# Patient Record
Sex: Female | Born: 1980
Health system: Southern US, Community
[De-identification: ages and names within clinical notes are randomized; demographics above are authoritative.]

## PROBLEM LIST (undated history)

## (undated) DIAGNOSIS — N390 Urinary tract infection, site not specified: Secondary | ICD-10-CM

## (undated) DIAGNOSIS — B019 Varicella without complication: Secondary | ICD-10-CM

## (undated) DIAGNOSIS — A749 Chlamydial infection, unspecified: Secondary | ICD-10-CM

## (undated) DIAGNOSIS — D649 Anemia, unspecified: Secondary | ICD-10-CM

## (undated) DIAGNOSIS — T7840XA Allergy, unspecified, initial encounter: Secondary | ICD-10-CM

## (undated) HISTORY — PX: WISDOM TOOTH EXTRACTION: SHX21

## (undated) HISTORY — DX: Urinary tract infection, site not specified: N39.0

## (undated) HISTORY — DX: Allergy, unspecified, initial encounter: T78.40XA

## (undated) HISTORY — DX: Anemia, unspecified: D64.9

## (undated) HISTORY — DX: Chlamydial infection, unspecified: A74.9

## (undated) HISTORY — DX: Varicella without complication: B01.9

---

## 2006-05-05 ENCOUNTER — Emergency Department (HOSPITAL_COMMUNITY): Admission: EM | Admit: 2006-05-05 | Discharge: 2006-05-05 | Payer: Self-pay | Admitting: Family Medicine

## 2008-11-23 ENCOUNTER — Inpatient Hospital Stay (HOSPITAL_COMMUNITY): Admission: AD | Admit: 2008-11-23 | Discharge: 2008-11-24 | Payer: Self-pay | Admitting: Obstetrics and Gynecology

## 2010-03-07 ENCOUNTER — Inpatient Hospital Stay (HOSPITAL_COMMUNITY): Admission: AD | Admit: 2010-03-07 | Discharge: 2009-07-16 | Payer: Self-pay | Admitting: Obstetrics and Gynecology

## 2010-06-18 LAB — CBC
HCT: 36.4 % (ref 36.0–46.0)
Hemoglobin: 11.8 g/dL — ABNORMAL LOW (ref 12.0–15.0)
MCHC: 32.3 g/dL (ref 30.0–36.0)
MCHC: 32.9 g/dL (ref 30.0–36.0)
MCV: 86.8 fL (ref 78.0–100.0)
Platelets: 225 10*3/uL (ref 150–400)
Platelets: 284 10*3/uL (ref 150–400)
RDW: 14.5 % (ref 11.5–15.5)

## 2010-07-06 LAB — HCG, QUANTITATIVE, PREGNANCY: hCG, Beta Chain, Quant, S: 17291 m[IU]/mL — ABNORMAL HIGH (ref <5)

## 2011-05-28 ENCOUNTER — Encounter: Payer: BC Managed Care – PPO | Admitting: Obstetrics and Gynecology

## 2011-10-22 ENCOUNTER — Encounter: Payer: Self-pay | Admitting: Family Medicine

## 2011-10-22 ENCOUNTER — Ambulatory Visit (INDEPENDENT_AMBULATORY_CARE_PROVIDER_SITE_OTHER): Payer: BC Managed Care – PPO | Admitting: Family Medicine

## 2011-10-22 VITALS — BP 132/79 | HR 80 | Temp 98.3°F | Ht 66.5 in | Wt 172.8 lb

## 2011-10-22 DIAGNOSIS — D649 Anemia, unspecified: Secondary | ICD-10-CM

## 2011-10-22 DIAGNOSIS — Z0000 Encounter for general adult medical examination without abnormal findings: Secondary | ICD-10-CM

## 2011-10-22 LAB — CBC WITH DIFFERENTIAL/PLATELET
Basophils Absolute: 0 10*3/uL (ref 0.0–0.1)
Eosinophils Absolute: 0.4 10*3/uL (ref 0.0–0.7)
Lymphocytes Relative: 40.6 % (ref 12.0–46.0)
MCHC: 32.2 g/dL (ref 30.0–36.0)
Neutrophils Relative %: 46.8 % (ref 43.0–77.0)
Platelets: 329 10*3/uL (ref 150.0–400.0)
RDW: 18.2 % — ABNORMAL HIGH (ref 11.5–14.6)

## 2011-10-22 LAB — HEPATIC FUNCTION PANEL
ALT: 12 U/L (ref 0–35)
AST: 14 U/L (ref 0–37)
Bilirubin, Direct: 0 mg/dL (ref 0.0–0.3)
Total Bilirubin: 0.3 mg/dL (ref 0.3–1.2)

## 2011-10-22 LAB — LIPID PANEL
Cholesterol: 186 mg/dL (ref 0–200)
LDL Cholesterol: 124 mg/dL — ABNORMAL HIGH (ref 0–99)
VLDL: 10 mg/dL (ref 0.0–40.0)

## 2011-10-22 LAB — BASIC METABOLIC PANEL
Chloride: 105 mEq/L (ref 96–112)
Creatinine, Ser: 0.7 mg/dL (ref 0.4–1.2)
Potassium: 3.5 mEq/L (ref 3.5–5.1)
Sodium: 137 mEq/L (ref 135–145)

## 2011-10-22 NOTE — Patient Instructions (Addendum)
You look great!  Keep up the good work! We'll notify you of your lab results Try and get regular exercise and make healthy food choices Call with any questions or concerns- particularly if you continue to have trouble sleeping! Think of [name] as your home base! Welcome!  We're glad to have you!!!

## 2011-10-22 NOTE — Progress Notes (Signed)
  Subjective:    Patient ID: Tracy Burch, female    DOB: 10/21/1980, 31 y.o.   MRN: [id number]  HPI New to establish.  Previous MD- None  OB- [facility]  No concerns today.   Review of Systems Patient reports no vision/ hearing changes, adenopathy,fever, weight change,  persistant/recurrent hoarseness , swallowing issues, chest pain, palpitations, edema, persistant/recurrent cough, hemoptysis, dyspnea (rest/exertional/paroxysmal nocturnal), gastrointestinal bleeding (melena, rectal bleeding), abdominal pain, significant heartburn, bowel changes, GU symptoms (dysuria, hematuria, incontinence), Gyn symptoms (abnormal  bleeding, pain),  syncope, focal weakness, memory loss, numbness & tingling, skin/hair/nail changes, abnormal bruising or bleeding, anxiety, or depression.     Objective:   Physical Exam General Appearance:    Alert, cooperative, no distress, appears stated age  Head:    Normocephalic, without obvious abnormality, atraumatic  Eyes:    PERRL, conjunctiva/corneas clear, EOM's intact, fundi    benign, both eyes  Ears:    Normal TM's and external ear canals, both ears  Nose:   Nares normal, septum midline, mucosa normal, no drainage    or sinus tenderness  Throat:   Lips, mucosa, and tongue normal; teeth and gums normal  Neck:   Supple, symmetrical, trachea midline, no adenopathy;    Thyroid: no enlargement/tenderness/nodules  Back:     Symmetric, no curvature, ROM normal, no CVA tenderness  Lungs:     Clear to auscultation bilaterally, respirations unlabored  Chest Wall:    No tenderness or deformity   Heart:    Regular rate and rhythm, S1 and S2 normal, no murmur, rub   or gallop  Breast Exam:    Deferred to GYN  Abdomen:     Soft, non-tender, bowel sounds active all four quadrants,    no masses, no organomegaly  Genitalia:    Deferred to GYN  Rectal:    Extremities:   Extremities normal, atraumatic, no cyanosis or edema  Pulses:   2+ and symmetric all  extremities  Skin:   Skin color, texture, turgor normal, no rashes or lesions  Lymph nodes:   Cervical, supraclavicular, and axillary nodes normal  Neurologic:   CNII-XII intact, normal strength, sensation and reflexes    throughout          Assessment & Plan:

## 2011-10-22 NOTE — Assessment & Plan Note (Signed)
Pt's PE WNL.  UTD on GYN.  Check labs.  Anticipatory guidance provided.  Pt to f/u if sleeping habits don't improve- this was not addressed today.

## 2011-10-23 MED ORDER — FERROUS SULFATE 325 (65 FE) MG PO TABS: 325.0000 mg | ORAL_TABLET | Freq: Every day | ORAL | Status: DC

## 2011-10-23 NOTE — Addendum Note (Signed)
Addended by: Derry Lory A on: 10/23/2011 05:35 PM   Modules accepted: Orders

## 2011-10-26 LAB — VITAMIN D 1,25 DIHYDROXY: Vitamin D3 1, 25 (OH)2: 103 pg/mL

## 2011-12-03 ENCOUNTER — Encounter: Payer: Self-pay | Admitting: Obstetrics and Gynecology

## 2011-12-03 ENCOUNTER — Telehealth: Payer: Self-pay | Admitting: Obstetrics and Gynecology

## 2011-12-03 ENCOUNTER — Ambulatory Visit (INDEPENDENT_AMBULATORY_CARE_PROVIDER_SITE_OTHER): Payer: BC Managed Care – PPO | Admitting: Obstetrics and Gynecology

## 2011-12-03 ENCOUNTER — Telehealth: Payer: Self-pay | Admitting: Family Medicine

## 2011-12-03 VITALS — BP 114/72 | HR 78 | Wt 174.0 lb

## 2011-12-03 DIAGNOSIS — Z113 Encounter for screening for infections with a predominantly sexual mode of transmission: Secondary | ICD-10-CM

## 2011-12-03 DIAGNOSIS — N898 Other specified noninflammatory disorders of vagina: Secondary | ICD-10-CM

## 2011-12-03 LAB — POCT WET PREP (WET MOUNT)

## 2011-12-03 MED ORDER — FLUCONAZOLE 150 MG PO TABS: 150.0000 mg | ORAL_TABLET | Freq: Once | ORAL | Status: AC

## 2011-12-03 MED ORDER — METRONIDAZOLE 500 MG PO TABS: 500.0000 mg | ORAL_TABLET | Freq: Three times a day (TID) | ORAL | Status: AC

## 2011-12-03 NOTE — Patient Instructions (Signed)
Bacterial Vaginosis Bacterial vaginosis (BV) is a vaginal infection where the normal balance of bacteria in the vagina is disrupted. The normal balance is then replaced by an overgrowth of certain bacteria. There are several different kinds of bacteria that can cause BV. BV is the most common vaginal infection in women of childbearing age. CAUSES   The cause of BV is not fully understood. BV develops when there is an increase or imbalance of harmful bacteria.   Some activities or behaviors can upset the normal balance of bacteria in the vagina and put women at increased risk including:   Having a new sex partner or multiple sex partners.   Douching.   Using an intrauterine device (IUD) for contraception.   It is not clear what role sexual activity plays in the development of BV. However, women that have never had sexual intercourse are rarely infected with BV.  Women do not get BV from toilet seats, bedding, swimming pools or from touching objects around them.  SYMPTOMS   Grey vaginal discharge.   A fish-like odor with discharge, especially after sexual intercourse.   Itching or burning of the vagina and vulva.   Burning or pain with urination.    Avoid: - excess soap on genital area (consider using plain oatmeal soap) - use of powder or sprays in genital area - douching - wearing underwear to bed (except with menses) - using more than is directed detergent when washing clothes - tight fitting garments around genital area - excess sugar intake    Some women have no signs or symptoms at all.  DIAGNOSIS  Your caregiver must examine the vagina for signs of BV. Your caregiver will perform lab tests and look at the sample of vaginal fluid through a microscope. They will look for bacteria and abnormal cells (clue cells), a pH test higher than 4.5, and a positive amine test all associated with BV.  RISKS AND COMPLICATIONS   Pelvic inflammatory disease (PID).   Infections  following gynecology surgery.   Developing HIV.   Developing herpes virus.  TREATMENT  Sometimes BV will clear up without treatment. However, all women with symptoms of BV should be treated to avoid complications, especially if gynecology surgery is planned. Female partners generally do not need to be treated. However, BV may spread between female sex partners so treatment is helpful in preventing a recurrence of BV.   BV may be treated with antibiotics. The antibiotics come in either pill or vaginal cream forms. Either can be used with nonpregnant or pregnant women, but the recommended dosages differ. These antibiotics are not harmful to the baby.   BV can recur after treatment. If this happens, a second round of antibiotics will often be prescribed.   Treatment is important for pregnant women. If not treated, BV can cause a premature delivery, especially for a pregnant woman who had a premature birth in the past. All pregnant women who have symptoms of BV should be checked and treated.   For chronic reoccurrence of BV, treatment with a type of prescribed gel vaginally twice a week is helpful.  HOME CARE INSTRUCTIONS   Finish all medication as directed by your caregiver.   Do not have sex until treatment is completed.   Tell your sexual partner that you have a vaginal infection. They should see their caregiver and be treated if they have problems, such as a mild rash or itching.   Practice safe sex. Use condoms. Only have 1 sex partner.  PREVENTION  Basic prevention steps can help reduce the risk of upsetting the natural balance of bacteria in the vagina and developing BV:  Do not have sexual intercourse (be abstinent).   Do not douche.   Use all of the medicine prescribed for treatment of BV, even if the signs and symptoms go away.   Tell your sex partner if you have BV. That way, they can be treated, if needed, to prevent reoccurrence.  SEEK MEDICAL CARE IF:   Your symptoms are  not improving after 3 days of treatment.   You have increased discharge, pain, or fever.  MAKE SURE YOU:   Understand these instructions.   Will watch your condition.   Will get help right away if you are not doing well or get worse.  FOR MORE INFORMATION  Division of STD Prevention (DSTDP), Centers for Disease Control and Prevention: [employer] American Social Health Association (ASHA): www.ashastd.org  Document Released: 03/17/2005 Document Revised: 03/06/2011 Document Reviewed: 09/07/2008 [facility] Patient Information 2012 [place], [place].

## 2011-12-03 NOTE — Telephone Encounter (Signed)
TRIAGE/APPT REQ. °

## 2011-12-03 NOTE — Telephone Encounter (Signed)
Caller: Tracy Burch/Patient; Patient Name: Tracy Burch; PCP: Sheliah Hatch.; Best Callback Phone Number: [phone]; Reason for call: Chest Pain/Chest Discomfort.  Onset 12/01/11.   Sharp chest pain with expiration and inspiration. Pain located in the center of the chest.  No trauma.  Afebrile.  Pt does cough when pain occurs and then it goes away.  All emergent symptoms ruled out per Chest Pain protocol with exception to 'Pains are sharp, stabbing, come and go and last only a few seconds and not previous evaluated'.  See Provider within 72 hrs. Transferred caller to office to schedule an appt.

## 2011-12-03 NOTE — Progress Notes (Signed)
Color: white Odor: no Itching:yes Thin:no Thick:yes Fever:no Dyspareunia:no Hx PID:no HX STD:no Pelvic Pain:no Desires Gc/CT:yes Desires HIV,RPR,HbsAG:no

## 2011-12-03 NOTE — Telephone Encounter (Signed)
Noted will see MD Beverely Low tomorrow, given verba order from MD Tabori ok for pt to wait for apt tomorrow per sxs listed as going away

## 2011-12-03 NOTE — Telephone Encounter (Signed)
Tc to pt regarding msg.  Pt states she has been having a thick vaginal d/c and slight itching, denies any odor.  Pt scheduled for eval today w/ EP @ 1330.

## 2011-12-03 NOTE — Progress Notes (Addendum)
30 YO complains of a heavy vaginal discharge with mild itchiness.   Also wants to be tested for GC/CT.  O: Wet Prep: pH-5.5,  whiff-positive,  many-clue cells  Pelvic:  EGBUS-with adherent white discharge, vagina with large discharge-grey, cervix-no lesions, uterus-normal, adnexae-normal   A: Bacterial Vaginosis  P: Metronidazole 500 mg bid x 7 days, no refills;  Etoh precautions     Perineal hygiene     Diflucan 150 mg  #1 1 po stat 1 refill     Reviewed causes of BV & Yeast and difference in the two     RTO-as scheduled or prn  Keino Placencia, PA-C

## 2011-12-04 ENCOUNTER — Ambulatory Visit (INDEPENDENT_AMBULATORY_CARE_PROVIDER_SITE_OTHER): Payer: BC Managed Care – PPO | Admitting: Family Medicine

## 2011-12-04 ENCOUNTER — Encounter: Payer: Self-pay | Admitting: Family Medicine

## 2011-12-04 VITALS — BP 121/75 | HR 94 | Temp 98.5°F | Ht 67.0 in | Wt 170.2 lb

## 2011-12-04 DIAGNOSIS — G4700 Insomnia, unspecified: Secondary | ICD-10-CM

## 2011-12-04 DIAGNOSIS — R079 Chest pain, unspecified: Secondary | ICD-10-CM | POA: Insufficient documentation

## 2011-12-04 MED ORDER — NAPROXEN 500 MG PO TABS: 500.0000 mg | ORAL_TABLET | Freq: Two times a day (BID) | ORAL | Status: DC

## 2011-12-04 MED ORDER — TRAZODONE HCL 50 MG PO TABS: 25.0000 mg | ORAL_TABLET | Freq: Every evening | ORAL | Status: DC | PRN

## 2011-12-04 NOTE — Assessment & Plan Note (Signed)
New.  Due to failure of OTC meds will start Trazodone.  Pt expressed understanding and is in agreement w/ plan.

## 2011-12-04 NOTE — Patient Instructions (Addendum)
I suspect this is costochondritis (inflammation of the cartilage and ribs) Start the Naproxen twice daily (w/ food) for 7-10 days and then as needed If the pain changes or worsens- please call or go to the ER Your EKG is normal and this doesn't appear to be your heart- this is good news! Call with any questions or concerns Hang in there!!

## 2011-12-04 NOTE — Assessment & Plan Note (Signed)
New.  Unlikely to be cardiac as it is not associated w/ exertion, + TTP on chest wall, few risk factors.  EKG done- see document for interpretation.  Suspect costochondritis vs GERD.  Start NSAIDs.  Reviewed supportive care and red flags that should prompt return.  Pt expressed understanding and is in agreement w/ plan.

## 2011-12-04 NOTE — Progress Notes (Signed)
  Subjective:    Patient ID: Tracy Burch, female    DOB: February 04, 1981, 31 y.o.   MRN: [id number]  HPI Chest pain- sxs started Sunday, occurs w/ breathing.  Worse in the AM.  Has not worsened since Sunday.  sxs improve as day goes on.  Denies SOB.  No chest tenderness to palpation.  Pain is 'dead center'.  No radiation of pain.  Nothing makes pain worse.  Nothing improves pain- 'i haven't done anything'.  Pain will last ~1 minute.  Resolves spontaneously.  Sometimes pain is 'sharp' and other times is 'throbbing'.  Has hx of similar 2 yrs ago.  Denies increased stress.  Hx of seasonal allergies- taking allegra daily.  Denies GERD- 'i didn't even have that when i was pregnant'  Insomnia- ongoing problem for pt.  Has tried OTC unisom, tylenol PM, advil PM w/out relief.  Will take 1-2 hrs to fall asleep and 'then i'm up every hour'.  Denies anxiety, palpitations.  'i just want one good night's sleep!'   Review of Systems For ROS see HPI     Objective:   Physical Exam  Vitals reviewed. Constitutional: She is oriented to person, place, and time. She appears well-developed and well-nourished. No distress.  HENT:  Head: Normocephalic and atraumatic.  Eyes: Conjunctivae and EOM are normal. Pupils are equal, round, and reactive to light.  Neck: Normal range of motion. Neck supple. No thyromegaly present.  Cardiovascular: Normal rate, regular rhythm, normal heart sounds and intact distal pulses.   No murmur heard. Pulmonary/Chest: Effort normal and breath sounds normal. No respiratory distress. She exhibits tenderness (over sternocostal jxn).  Abdominal: Soft. She exhibits no distension. There is no tenderness.  Musculoskeletal: She exhibits no edema.  Lymphadenopathy:    She has no cervical adenopathy.  Neurological: She is alert and oriented to person, place, and time.  Skin: Skin is warm and dry.  Psychiatric: She has a normal mood and affect. Her behavior is normal.          Assessment &  Plan:

## 2012-01-28 ENCOUNTER — Telehealth: Payer: Self-pay

## 2012-01-28 MED ORDER — ESZOPICLONE 2 MG PO TABS: 2.0000 mg | ORAL_TABLET | Freq: Every evening | ORAL | Status: DC | PRN

## 2012-01-28 NOTE — Telephone Encounter (Signed)
Ok for [facility] 2mg  nightly prn.  #30, 1 refill

## 2012-01-28 NOTE — Telephone Encounter (Signed)
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 PLz advise      MW

## 2012-01-28 NOTE — Telephone Encounter (Signed)
Spoke to pt to advise results/instructions. Pt understood. rx sent to pharmacy by e-script Pt understood the rx has been sent

## 2012-02-10 ENCOUNTER — Telehealth: Payer: Self-pay | Admitting: *Deleted

## 2012-02-10 NOTE — Telephone Encounter (Signed)
Preferred drugs Ambien or sonata, would one of these med be appropriate for Pt. .Please advise

## 2012-02-10 NOTE — Telephone Encounter (Signed)
Pt requested Lunesta but can switch to Ambien 5mg  tabs QHS prn, #30, 1 refill

## 2012-02-11 NOTE — Telephone Encounter (Signed)
Left message to call office

## 2012-02-12 ENCOUNTER — Telehealth: Payer: Self-pay | Admitting: *Deleted

## 2012-02-12 MED ORDER — ZOLPIDEM TARTRATE 5 MG PO TABS: 5.0000 mg | ORAL_TABLET | Freq: Every evening | ORAL | Status: DC | PRN

## 2012-02-12 NOTE — Telephone Encounter (Signed)
Pt left msg on triage vmail asking for a return call. No other information provided.  

## 2012-02-12 NOTE — Telephone Encounter (Signed)
See other encounter.

## 2012-02-12 NOTE — Telephone Encounter (Signed)
Discuss with patient, Rx sent. 

## 2012-04-29 ENCOUNTER — Encounter: Payer: Self-pay | Admitting: Family Medicine

## 2012-04-29 ENCOUNTER — Ambulatory Visit (INDEPENDENT_AMBULATORY_CARE_PROVIDER_SITE_OTHER): Payer: BC Managed Care – PPO | Admitting: Family Medicine

## 2012-04-29 VITALS — BP 130/80 | HR 82 | Temp 97.7°F | Ht 67.75 in | Wt 183.8 lb

## 2012-04-29 DIAGNOSIS — G4700 Insomnia, unspecified: Secondary | ICD-10-CM

## 2012-04-29 DIAGNOSIS — H10029 Other mucopurulent conjunctivitis, unspecified eye: Secondary | ICD-10-CM | POA: Insufficient documentation

## 2012-04-29 MED ORDER — ESZOPICLONE 2 MG PO TABS: 2.0000 mg | ORAL_TABLET | Freq: Every evening | ORAL | Status: DC | PRN

## 2012-04-29 MED ORDER — POLYMYXIN B-TRIMETHOPRIM 10000-0.1 UNIT/ML-% OP SOLN: OPHTHALMIC | Status: DC

## 2012-04-29 NOTE — Progress Notes (Signed)
  Subjective:    Patient ID: Tracy Burch, female    DOB: [birth year], 32 y.o.   MRN: [id number]  HPI ? Pink eye- first noted yesterday.  Eye is red, irritated, itchy- 'feels like there's sand in it'.  Had thin, watery drainage this AM- not matted shut.  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  Insomnia- did not do well on Ambien, 'i did weird stuff', and trazodone isn't effective.  Wants to again try Lunesta and see if insurance will pay for it since 2 other options were unsuccessful.   Review of Systems For ROS see HPI     Objective:   Physical Exam  Vitals reviewed. Constitutional: She appears well-developed and well-nourished. No distress.  HENT:  Head: Normocephalic and atraumatic.       No TTP over sinuses TMs normal bilaterally  Eyes: EOM are normal. Pupils are equal, round, and reactive to light. Right eye exhibits no discharge. Left eye exhibits no discharge.       R conjunctival injxn w/ limbic sparing L conjunctiva WNL  Psychiatric: She has a normal mood and affect. Her behavior is normal. Thought content normal.          Assessment & Plan:

## 2012-04-29 NOTE — Patient Instructions (Addendum)
Start the eye drops as directed Try and avoid itching Wash hands frequently Start the [facility] in there!!!

## 2012-04-29 NOTE — Assessment & Plan Note (Signed)
Unchanged.  Pt has now failed Ambien and Trazodone.  Will again try and send Lunesta.

## 2012-04-29 NOTE — Assessment & Plan Note (Signed)
New.  No evidence of OM or sinusitis.  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  Pt expressed understanding and is in agreement w/ plan.

## 2012-05-03 ENCOUNTER — Ambulatory Visit: Payer: BC Managed Care – PPO | Admitting: Obstetrics and Gynecology

## 2012-05-03 ENCOUNTER — Encounter: Payer: Self-pay | Admitting: Obstetrics and Gynecology

## 2012-05-03 VITALS — BP 118/72 | Temp 98.5°F | Ht 65.0 in | Wt 189.0 lb

## 2012-05-03 DIAGNOSIS — Z01419 Encounter for gynecological examination (general) (routine) without abnormal findings: Secondary | ICD-10-CM

## 2012-05-03 DIAGNOSIS — Z124 Encounter for screening for malignant neoplasm of cervix: Secondary | ICD-10-CM

## 2012-05-03 MED ORDER — LEVONORGEST-ETH ESTRAD 91-DAY 0.15-0.03 MG PO TABS: 1.0000 | ORAL_TABLET | Freq: Every day | ORAL | Status: DC

## 2012-05-03 MED ORDER — AMBULATORY NON FORMULARY MEDICATION: 600.0000 mg | Status: DC

## 2012-05-03 MED ORDER — FLUCONAZOLE 150 MG PO TABS: 150.0000 mg | ORAL_TABLET | Freq: Once | ORAL | Status: DC

## 2012-05-03 NOTE — Patient Instructions (Signed)
[place] pharmacies that carry Boric Acid Capsules/Suppositories:  [facility] [address] (only)  [phone]  [facility]  [address] ([facility])  [phone]  [facility] ([place])  [address] [phone]  [facility]  [address] [phone]  [facility] [address] [place], [place] [phone]     

## 2012-05-03 NOTE — Progress Notes (Signed)
Subjective:    Tracy Burch is a 32 y.o. female, G1P0, who presents for an annual exam. The patient reports no complaints.  Menstrual cycle:   LMP: Patient's last menstrual period was 02/29/2012.             Review of Systems Pertinent items are noted in HPI. Denies pelvic pain, urinary tract symptoms, vaginitis symptoms, irregular bleeding, menopausal symptoms, change in bowel habits or rectal bleeding   Objective:    BP 118/72  Temp 98.5 F (36.9 C) (Oral)  Ht 5\' 5"  (1.651 m)  Wt 189 lb (85.73 kg)  BMI 31.45 kg/m2  LMP 02/29/2012    Wt Readings from Last 1 Encounters:  05/03/12 189 lb (85.73 kg)   Body mass index is 31.45 kg/(m^2). General Appearance: Alert, no acute distress HEENT: Grossly normal Neck / Thyroid: Supple, no thyromegaly or cervical adenopathy Lungs: Clear to auscultation bilaterally Back: No CVA tenderness Breast Exam: No masses or nodes.No dimpling, nipple retraction or discharge. Cardiovascular: Regular rate and rhythm.  Gastrointestinal: Soft, non-tender, no masses or organomegaly Pelvic Exam: EGBUS-wnl, vagina-normal rugae, cervix- without lesions or tenderness, uterus appears normal size shape and consistency, adnexae-no masses or tenderness Rectovaginal: no masses and normal sphincter tone Lymphatic Exam: Non-palpable nodes in neck, clavicular,  axillary, or inguinal regions  Skin: no rashes or abnormalities Extremities: no clubbing cyanosis or edema  Neurologic: grossly normal Psychiatric: Alert and oriented  Wet Prep: Urinalysis: UPT:    Assessment:   Routine GYN Exam   Plan:  Boric Acid Suppositories  600mg  #30 1 pv twice weekly x 4 weeks then as needed prn 11 refills  PAP sent  Seasonique  #1  1 po qd 4 refills  RTO 1 year or prn  Tyrell Seifer,ELMIRAPA-C

## 2012-05-03 NOTE — Progress Notes (Signed)
Regular Periods: yes Mammogram: no  Monthly Breast Ex.: yes Exercise: yes  Tetanus < 10 years: yes Seatbelts: yes  NI. Bladder Functn.: yes Abuse at home: no  Daily BM's: yes Stressful Work: no  Healthy Diet: yes Sigmoid-Colonoscopy: no  Calcium: no Medical problems this year: none   LAST PAP:1/13  Contraception: seasonique  Mammogram:  no  PCP: DR. Beverely Low  PMH: NO CHANGE  FMH: NO CHANGE  Last Bone Scan: NO  PT IS MARRIED.

## 2012-05-05 LAB — PAP IG W/ RFLX HPV ASCU

## 2012-05-10 ENCOUNTER — Telehealth: Payer: Self-pay | Admitting: *Deleted

## 2012-05-10 NOTE — Telephone Encounter (Signed)
Prior Auth approved 05-06-12 until 01-30-15, pharmacy faxed approval letter scan to chart

## 2012-07-12 ENCOUNTER — Telehealth: Payer: Self-pay | Admitting: *Deleted

## 2012-07-12 NOTE — Telephone Encounter (Signed)
Pt requesting a Rx for Singulair to help with her allergy. Last OV 04-29-12

## 2012-07-12 NOTE — Telephone Encounter (Signed)
Caller: Bryla/Patient; Phone: [phone]; Reason for Call: Please call patient at number listed.  She is requesting to get a prescription for Singulair (dose unknown), daily, as needed.  She is asking if Dr.  Beverely Low will prescribe this for her now instead of her specialists but will come in for office visit if necessary.    Pharmacy information is: [facility] [address] [place], [place], [phone].

## 2012-07-12 NOTE — Telephone Encounter (Signed)
Left message to call office

## 2012-07-12 NOTE — Telephone Encounter (Signed)
singulair 10 mg #30  0 refills    1 po qd

## 2012-07-12 NOTE — Telephone Encounter (Signed)
Is she currently on allergy meds?  Claritin/Zyrtec/Allegra daily?  Next step would be to add nasal steroid spray rather than Singulair.

## 2012-07-13 MED ORDER — MONTELUKAST SODIUM 10 MG PO TABS: 10.0000 mg | ORAL_TABLET | Freq: Every day | ORAL | Status: DC

## 2012-07-13 NOTE — Telephone Encounter (Signed)
Refill x1   2 refills 

## 2012-07-13 NOTE — Telephone Encounter (Signed)
Rx sent 

## 2012-08-30 ENCOUNTER — Telehealth: Payer: Self-pay | Admitting: General Practice

## 2012-08-30 MED ORDER — MONTELUKAST SODIUM 10 MG PO TABS: 10.0000 mg | ORAL_TABLET | Freq: Every day | ORAL | Status: DC

## 2012-08-30 NOTE — Telephone Encounter (Signed)
Ok for #30, 6 refills 

## 2012-08-30 NOTE — Telephone Encounter (Signed)
Singulair Last OV 04-29-12 Med last filled on 07-13-12 #30 with 0 refills

## 2012-08-30 NOTE — Telephone Encounter (Signed)
Rx sent to the pharmacy by e-script.//AB/CMA 

## 2012-10-26 ENCOUNTER — Encounter: Payer: Self-pay | Admitting: Family Medicine

## 2012-10-26 ENCOUNTER — Ambulatory Visit (INDEPENDENT_AMBULATORY_CARE_PROVIDER_SITE_OTHER): Payer: BC Managed Care – PPO | Admitting: Family Medicine

## 2012-10-26 VITALS — BP 118/80 | HR 82 | Temp 98.3°F | Ht 67.75 in | Wt 189.8 lb

## 2012-10-26 DIAGNOSIS — Z0000 Encounter for general adult medical examination without abnormal findings: Secondary | ICD-10-CM

## 2012-10-26 DIAGNOSIS — Z23 Encounter for immunization: Secondary | ICD-10-CM

## 2012-10-26 LAB — HEPATIC FUNCTION PANEL
ALT: 9 U/L (ref 0–35)
Alkaline Phosphatase: 57 U/L (ref 39–117)
Bilirubin, Direct: 0 mg/dL (ref 0.0–0.3)
Total Bilirubin: 0.2 mg/dL — ABNORMAL LOW (ref 0.3–1.2)
Total Protein: 7.3 g/dL (ref 6.0–8.3)

## 2012-10-26 LAB — CBC WITH DIFFERENTIAL/PLATELET
Basophils Absolute: 0.2 10*3/uL — ABNORMAL HIGH (ref 0.0–0.1)
Hemoglobin: 10.1 g/dL — ABNORMAL LOW (ref 12.0–15.0)
Lymphocytes Relative: 40.8 % (ref 12.0–46.0)
Monocytes Relative: 4.9 % (ref 3.0–12.0)
Neutrophils Relative %: 44.1 % (ref 43.0–77.0)
Platelets: 334 10*3/uL (ref 150.0–400.0)
RDW: 16.8 % — ABNORMAL HIGH (ref 11.5–14.6)

## 2012-10-26 LAB — BASIC METABOLIC PANEL
CO2: 26 mEq/L (ref 19–32)
Chloride: 106 mEq/L (ref 96–112)
Sodium: 137 mEq/L (ref 135–145)

## 2012-10-26 LAB — LIPID PANEL
LDL Cholesterol: 114 mg/dL — ABNORMAL HIGH (ref 0–99)
Total CHOL/HDL Ratio: 4
Triglycerides: 58 mg/dL (ref 0.0–149.0)
VLDL: 11.6 mg/dL (ref 0.0–40.0)

## 2012-10-26 MED ORDER — NAPROXEN 500 MG PO TABS: 500.0000 mg | ORAL_TABLET | Freq: Two times a day (BID) | ORAL | Status: DC

## 2012-10-26 MED ORDER — ZOLPIDEM TARTRATE 5 MG PO TABS: 5.0000 mg | ORAL_TABLET | Freq: Every evening | ORAL | Status: DC | PRN

## 2012-10-26 NOTE — Progress Notes (Signed)
  Subjective:    Patient ID: Tracy Burch, female    DOB: 01-02-1981, 32 y.o.   MRN: [id number]  HPI CPE- UTD on GYN.   Review of Systems Patient reports no vision/ hearing changes, adenopathy,fever, weight change,  persistant/recurrent hoarseness , swallowing issues, chest pain, palpitations, edema, persistant/recurrent cough, hemoptysis, dyspnea (rest/exertional/paroxysmal nocturnal), gastrointestinal bleeding (melena, rectal bleeding), abdominal pain, significant heartburn, bowel changes, GU symptoms (dysuria, hematuria, incontinence), Gyn symptoms (abnormal  bleeding, pain),  syncope, focal weakness, memory loss, numbness & tingling, skin/hair/nail changes, abnormal bruising or bleeding, anxiety, or depression.     Objective:   Physical Exam General Appearance:    Alert, cooperative, no distress, appears stated age  Head:    Normocephalic, without obvious abnormality, atraumatic  Eyes:    PERRL, conjunctiva/corneas clear, EOM's intact, fundi    benign, both eyes  Ears:    Normal TM's and external ear canals, both ears  Nose:   Nares normal, septum midline, mucosa normal, no drainage    or sinus tenderness  Throat:   Lips, mucosa, and tongue normal; teeth and gums normal  Neck:   Supple, symmetrical, trachea midline, no adenopathy;    Thyroid: no enlargement/tenderness/nodules  Back:     Symmetric, no curvature, ROM normal, no CVA tenderness  Lungs:     Clear to auscultation bilaterally, respirations unlabored  Chest Wall:    No tenderness or deformity   Heart:    Regular rate and rhythm, S1 and S2 normal, no murmur, rub   or gallop  Breast Exam:    Deferred to GYN  Abdomen:     Soft, non-tender, bowel sounds active all four quadrants,    no masses, no organomegaly  Genitalia:    Deferred to GYN  Rectal:    Extremities:   Extremities normal, atraumatic, no cyanosis or edema  Pulses:   2+ and symmetric all extremities  Skin:   Skin color, texture, turgor normal, no rashes or  lesions  Lymph nodes:   Cervical, supraclavicular, and axillary nodes normal  Neurologic:   CNII-XII intact, normal strength, sensation and reflexes    throughout          Assessment & Plan:

## 2012-10-26 NOTE — Assessment & Plan Note (Signed)
Pt's PE WNL.  Tdap updated.  Check labs.  Anticipatory guidance provided.  

## 2012-10-26 NOTE — Patient Instructions (Signed)
If no improvement in the hip or abd pain- please call Restart the Naproxen twice daily- take w/ food We'll notify you of your lab results and make any changes if needed Keep up the good work!  You look great! Call with any questions or concerns Enjoy the rest of summer!!!

## 2012-10-26 NOTE — Addendum Note (Signed)
Addended by: Verdie Shire on: 10/26/2012 09:42 AM   Modules accepted: Orders

## 2012-10-30 LAB — VITAMIN D 1,25 DIHYDROXY
Vitamin D2 1, 25 (OH)2: <8 pg/mL
Vitamin D3 1, 25 (OH)2: 91 pg/mL

## 2013-02-08 ENCOUNTER — Encounter: Payer: Self-pay | Admitting: Lab

## 2013-02-09 ENCOUNTER — Encounter: Payer: Self-pay | Admitting: Family Medicine

## 2013-02-09 ENCOUNTER — Ambulatory Visit (INDEPENDENT_AMBULATORY_CARE_PROVIDER_SITE_OTHER): Payer: BC Managed Care – PPO | Admitting: Family Medicine

## 2013-02-09 VITALS — BP 140/80 | HR 83 | Temp 98.2°F | Resp 16 | Wt 188.2 lb

## 2013-02-09 DIAGNOSIS — R1032 Left lower quadrant pain: Secondary | ICD-10-CM | POA: Insufficient documentation

## 2013-02-09 NOTE — Progress Notes (Signed)
  Subjective:    Patient ID: Tracy Burch, female    DOB: 1980-12-01, 32 y.o.   MRN: [id number]  HPI Pre visit review using our clinic review tool, if applicable. No additional management support is needed unless otherwise documented below in the visit note.  L groin pain- first started ~18 months ago.  Went to [facility]- 'we don't see anything'.  Last week pain returned- sharp and intermittent.  Monday woke pt from sleep.  This episode was 'at least 5 minutes'.  Pain typically occurs at night.  Resolves spontaneously- nothing improves pain and nothing worsens pain.  Had pelvic US x2 that showed no ovarian cyst or GYN abnormality.  No changes in bowel or bladder habits.  No heavy lifting.   Review of Systems For ROS see HPI     Objective:   Physical Exam  Vitals reviewed. Constitutional: She appears well-developed and well-nourished. No distress.  Cardiovascular: Normal rate, regular rhythm and normal heart sounds.   Pulmonary/Chest: Effort normal and breath sounds normal. No respiratory distress. She has no wheezes. She has no rales.  Abdominal: Soft. Bowel sounds are normal. She exhibits no distension. There is tenderness (LLQ TTP). There is guarding (voluntary guarding). There is no rebound.          Assessment & Plan:

## 2013-02-09 NOTE — Patient Instructions (Signed)
Follow up as needed We'll notify you of your CT appt and the results Drink plenty of fluids Ibuprofen as needed Hang in there!!

## 2013-02-11 ENCOUNTER — Ambulatory Visit (HOSPITAL_BASED_OUTPATIENT_CLINIC_OR_DEPARTMENT_OTHER): Payer: BC Managed Care – PPO

## 2013-02-12 ENCOUNTER — Ambulatory Visit (HOSPITAL_BASED_OUTPATIENT_CLINIC_OR_DEPARTMENT_OTHER)
Admission: RE | Admit: 2013-02-12 | Discharge: 2013-02-12 | Disposition: A | Discharge status: Home / Self Care | Payer: BC Managed Care – PPO | Source: Ambulatory Visit | Attending: Family Medicine | Admitting: Family Medicine

## 2013-02-12 DIAGNOSIS — R1032 Left lower quadrant pain: Secondary | ICD-10-CM

## 2013-02-12 MED ORDER — IOHEXOL 300 MG/ML  SOLN
100.0000 mL | Freq: Once | INTRAMUSCULAR | Status: AC | PRN
  Administered 2013-02-12: 100 mL via INTRAVENOUS

## 2013-02-13 NOTE — Assessment & Plan Note (Signed)
New to provider, ongoing for pt.  Has had GYN w/u and [name] x2.  Has not had CT scan.  Will order to assess for etiology of pain.  Next steps will depend on CT results

## 2013-05-02 LAB — HM PAP SMEAR: HM PAP: NORMAL

## 2013-05-09 ENCOUNTER — Emergency Department (HOSPITAL_COMMUNITY)
Admission: EM | Admit: 2013-05-09 | Discharge: 2013-05-09 | Disposition: A | Discharge status: Home / Self Care | Payer: BC Managed Care – PPO | Source: Home / Self Care | Attending: Family Medicine | Admitting: Family Medicine

## 2013-05-09 ENCOUNTER — Encounter (HOSPITAL_COMMUNITY): Payer: Self-pay | Admitting: Emergency Medicine

## 2013-05-09 DIAGNOSIS — M222X9 Patellofemoral disorders, unspecified knee: Secondary | ICD-10-CM

## 2013-05-09 DIAGNOSIS — M25569 Pain in unspecified knee: Secondary | ICD-10-CM

## 2013-05-09 MED ORDER — DICLOFENAC SODIUM 75 MG PO TBEC: 75.0000 mg | DELAYED_RELEASE_TABLET | Freq: Two times a day (BID) | ORAL | Status: DC | PRN

## 2013-05-09 NOTE — ED Notes (Signed)
C/o left knee pain.  Denies any known injury.  States"pain is felt with stepping.  Throbbing/achey pain".  Pain felt "inside knee".  No otc meds tried for pain.

## 2013-05-09 NOTE — Discharge Instructions (Signed)
Thank you for coming in today. You have patellofemoral pain syndrome This is due to weakness of the hip abductors and VMO. This will get better with dedicated exercises.   1) hip abduction strength: Side leg raises 15-30 repetitions 2 sets twice daily.  2) VMO strengthening:  --- Straight leg raise --- Toe out straight leg raise --- Ball squeeze knee extension  Add one or 2 pound ankle weights and decreased to 15 repetitions when able to do it  easily.   If you do not hear from physical therapy please call them at [phone] in a few days.  Followup with Dr. Tamala Julian of our sports medicine in about 4 weeks   Patellofemoral Syndrome If you have had pain in the front of your knee for a long time, chances are good that you have patellofemoral syndrome. The word patella refers to the kneecap. Femoral (or femur) refers to the thigh bone. That is the bone the kneecap sits on. The kneecap is shaped like a triangle. Its job is to protect the knee and to improve the efficiency of your thigh muscles (quadriceps). The underside of the kneecap is made of smooth tissue (cartilage). This lets the kneecap slide up and down as the knee moves. Sometimes this cartilage becomes soft. Your healthcare provider may say the cartilage breaks down. That is patellofemoral syndrome. It can affect one knee, or both. The condition is sometimes called patellofemoral pain syndrome. That is because the condition is painful. The pain usually gets worse with activity. Sitting for a long time with the knee bent also makes the pain worse. It usually gets better with rest and proper treatment. CAUSES  No one is sure why some people develop this problem and others do not. Runners often get it. One name for the condition is "runner's knee." However, some people run for years and never have knee pain. Certain things seem to make patellofemoral syndrome more likely. They include:  Moving out of alignment. The kneecap is supposed to  move in a straight line when the thigh muscle pulls on it. Sometimes the kneecap moves in poor alignment. That can make the knee swell and hurt. Some experts believe it also wears down the cartilage.  Injury to the kneecap.  Strain on the knee. This may occur during sports activity. Soccer, running, skiing and cycling can put excess stress on the knee.  Being flat-footed or knock-kneed. SYMPTOMS   Knee pain.  Pain under the kneecap. This is usually a dull, aching pain.  Pain in the knee when doing certain things: squatting, kneeling, going up or down stairs.  Pain in the knee when you stand up after sitting down for awhile.  Tightness in the knee.  Loss of muscle strength in the thigh.  Swelling of the knee. DIAGNOSIS  Healthcare providers often send people with knee pain to an orthopedic caregiver. This person has special training to treat problems with bones and joints. To decide what is causing your knee pain, your caregiver will probably:  Do a physical exam. This will probably include:  Asking about symptoms you have noticed.  Asking about your activities and any injuries.  Feeling your knee. Moving it. This will help test the knee's strength. It will also check alignment (whether the knee and leg are aligned normally).  Order some tests, such as:  Imaging tests. They create pictures of the inside of the knee. Tests may include:  X-rays.  Computed tomography (CT) scan. This uses X-rays and a computer  to show more detail.  Magnetic resonance imaging (MRI). This test uses magnets, radio waves and a computer to make pictures. TREATMENT   Medication is almost always used first. It can relieve pain. It also can reduce swelling. Non-steroidal anti-inflammatory medicines (called NSAIDs) are usually suggested. Sometimes a stronger form is needed. A stronger form would require a prescription.  Other treatment may be needed after the swelling goes down. Possibilities  include:  Exercise. Certain exercises can make the muscles around the knee stronger which decreases the pressure on the knee cap. This includes the thigh muscle. Certain exercises also may be suggested to increase your flexibility.  A knee brace. This gives the knee extra support and helps align the movement of the knee cap.  Orthotics. These are special shoe inserts. They can help keep your leg and knee aligned.  Surgery is sometimes needed. This is rare. Options include:  Arthroscopy. The surgeon uses a special tool to remove any damaged pieces of the kneecap. Only a few small incisions (cuts) are needed.  Realignment. This is open surgery. The goals are to reduce pressure and fix the way the kneecap moves. HOME CARE INSTRUCTIONS   Take any medication prescribed by your healthcare provider. Follow the directions carefully.  If your knee is swollen:  Put ice or cold packs on it. Do this for 20 to 30 minutes, 3 to 4 times a day.  Keep the knee raised. Make sure it is supported. Put a pillow under it.  Rest your knee. For example, take the elevator instead of the stairs for awhile. Or, take a break from sports activity that strain your knee. Try walking or swimming instead.  Whenever you are active:  Use an elastic bandage on your knee. This gives it support.  After any activity, put ice or cold packs on your knees. Do this for about 10 to 20 minutes.  Make sure you wear shoes that give good support. Make sure they are not worn down. The heels should not slant in or out. SEEK MEDICAL CARE IF:   Knee pain gets worse. Or it does not go away, even after taking pain medicine.  Swelling does not go down.  Your thigh muscle becomes weak.  You have an oral temperature above 102 F (38.9 C). SEEK IMMEDIATE MEDICAL CARE IF:  You have an oral temperature above 102 F (38.9 C), not controlled by medicine. Document Released: 03/05/2009 Document Revised: 06/09/2011 Document Reviewed:  03/05/2009 [facility] Patient Information 2014 [place], [place].

## 2013-05-09 NOTE — ED Provider Notes (Signed)
Tracy Burch is a 33 y.o. female who presents to Urgent Care today for left anterior knee pain. Patient has had moderate left anterior knee pain off and on for several months however it worsened about one week ago. She denies any injury or swelling. The pain is moderate and worse with stairs and prolonged sitting. No radiating pain weakness or numbness. No medications tried. She feels well otherwise.   Past Medical History  Diagnosis Date  . Allergy   . Chicken pox   . UTI (urinary tract infection)   . Anemia   . Chlamydia     remote h/o   History  Substance Use Topics  . Smoking status: Never Smoker   . Smokeless tobacco: Never Used  . Alcohol Use: Yes     Comment: ocassionally   ROS as above Medications: No current facility-administered medications for this encounter.   Current Outpatient Prescriptions  Medication Sig Dispense Refill  . levonorgestrel-ethinyl estradiol (SEASONALE,INTROVALE,JOLESSA) 0.15-0.03 MG tablet Take 1 tablet by mouth daily.  1 Package  11  . zolpidem (AMBIEN) 5 MG tablet Take 1 tablet (5 mg total) by mouth at bedtime as needed for sleep.  30 tablet  1  . AMBULATORY NON FORMULARY MEDICATION Place 600 mg vaginally 2 (two) times a week. Medication Name: Boric Acid Capsules 600 mg  1 pv twice weekly for 4 weeks then prn  30 suppository  11  . diclofenac (VOLTAREN) 75 MG EC tablet Take 1 tablet (75 mg total) by mouth 2 (two) times daily as needed.  60 tablet  0  . ferrous sulfate 325 (65 FE) MG tablet Take 1 tablet (325 mg total) by mouth daily with breakfast.  30 tablet  5  . montelukast (SINGULAIR) 10 MG tablet Take 1 tablet (10 mg total) by mouth at bedtime.  30 tablet  6    Exam:  BP 129/82  Pulse 81  Temp(Src) 98.4 F (36.9 C) (Oral)  Resp 16  SpO2 100%  LMP 04/25/2013 Gen: Well NAD Left knee: Normal-appearing no effusions. Decreased VMO bulk. Range of motion 0-120. No crepitations on extension. Nontender throughout. Stable ligamentous  exam. Positive patellar grind tests.  Right knee: Normal-appearing no effusion slight decreased VMO bulk. Range of motion 0-120. No crepitations on extension. Nontender throughout. Stable ligamentous exam. Negative patellar grind tests.  Left hip: Normal-appearing nontender normal range of motion. Decreased hip abduction strength 4/5  Right hip: Normal-appearing nontender normal range of motion. Decreased hip abduction strength 4+/5  Capillary refill sensation are intact distally   Assessment and Plan: 33 y.o. female with left knee patellofemoral pain syndrome. No evidence of chondromalacia on exam. This is secondary to hip adduction weakness and VMO weakness. Plan for home exercise program focusing on hip abduction strength and VMO strengthening. Will avoid step exercises at this point for knee pain. Additionally we'll use NSAIDs for pain control.  Refer to physical therapy for observation of exercises and followup with Dr. Anola Gurney sports medicine about one month for re test  Discussed warning signs or symptoms. Please see discharge instructions. Patient expresses understanding.    Gregor Hams, MD 05/09/13 [phone]

## 2013-05-27 ENCOUNTER — Encounter: Payer: Self-pay | Admitting: Family Medicine

## 2013-05-27 ENCOUNTER — Ambulatory Visit (INDEPENDENT_AMBULATORY_CARE_PROVIDER_SITE_OTHER): Payer: BC Managed Care – PPO | Admitting: Family Medicine

## 2013-05-27 VITALS — BP 120/78 | HR 79 | Temp 98.4°F | Resp 16 | Wt 192.2 lb

## 2013-05-27 DIAGNOSIS — M25562 Pain in left knee: Secondary | ICD-10-CM | POA: Insufficient documentation

## 2013-05-27 DIAGNOSIS — M25569 Pain in unspecified knee: Secondary | ICD-10-CM

## 2013-05-27 DIAGNOSIS — D239 Other benign neoplasm of skin, unspecified: Secondary | ICD-10-CM

## 2013-05-27 DIAGNOSIS — D229 Melanocytic nevi, unspecified: Secondary | ICD-10-CM | POA: Insufficient documentation

## 2013-05-27 DIAGNOSIS — G4700 Insomnia, unspecified: Secondary | ICD-10-CM

## 2013-05-27 MED ORDER — ZOLPIDEM TARTRATE 5 MG PO TABS: 5.0000 mg | ORAL_TABLET | Freq: Every evening | ORAL | Status: DC | PRN

## 2013-05-27 NOTE — Assessment & Plan Note (Signed)
New.  Agree w/ dx given in ER.  Refer to sports med.  Will hold on PT at this time as Dr Tamala Julian may be able to provide HEP.  Stop Voltaren as this is not effective for pt.  OTC NSAIDs for pain relief, knee brace, ice, elevation.  Reviewed supportive care and red flags that should prompt return.  Pt expressed understanding and is in agreement w/ plan.

## 2013-05-27 NOTE — Assessment & Plan Note (Signed)
New.  Refer to derm for tx and removal.

## 2013-05-27 NOTE — Assessment & Plan Note (Signed)
Ongoing problem for pt.  Due for med refill- script provided.

## 2013-05-27 NOTE — Progress Notes (Signed)
   Subjective:    Patient ID: Tracy Burch, female    DOB: 06-27-1980, 33 y.o.   MRN: [id number]  HPI L knee pain- went to UC on 2/9 and dx'd w/ Patello-Femoral pain.  Was referred to Sports Med and PT (has not gone to either), started on Voltaren.  Still having pain.  No relief w/ voltaren, wants to stop taking.  Multiple moles- pt w/ multiple small moles and skin tags on neck.  This is painful and irritating to pt- hair and jewelry are getting stuck and irritated.  Would like to have them removed.  Insomnia- asking for refill on Ambien   Review of Systems For ROS see HPI     Objective:   Physical Exam  Vitals reviewed. Constitutional: She appears well-developed and well-nourished. No distress.  Cardiovascular: Intact distal pulses.   Musculoskeletal: She exhibits tenderness (over anterior superior L patella, worse w/ weight bearing and extension). She exhibits no edema.  Neurological: She has normal reflexes. Coordination normal.  Skin: Skin is warm and dry.  Multiple small moles and skin tags around neck w/ mild irritation          Assessment & Plan:

## 2013-05-27 NOTE — Progress Notes (Signed)
Pre visit review using our clinic review tool, if applicable. No additional management support is needed unless otherwise documented below in the visit note. 

## 2013-05-27 NOTE — Patient Instructions (Signed)
Follow up as needed Continue the Ambien as needed Get a neoprene sleeve w/ donut for the knee pain We'll call you with your sports med appt Ibuprofen, ice, elevate as needed We'll call you with your derm appt Call with any questions or concerns Hang in there!!

## 2013-06-13 ENCOUNTER — Ambulatory Visit: Payer: BC Managed Care – PPO | Admitting: Family Medicine

## 2013-06-15 ENCOUNTER — Encounter: Payer: Self-pay | Admitting: Family Medicine

## 2013-06-15 ENCOUNTER — Encounter: Payer: Self-pay | Admitting: *Deleted

## 2013-06-15 ENCOUNTER — Ambulatory Visit (INDEPENDENT_AMBULATORY_CARE_PROVIDER_SITE_OTHER): Payer: BC Managed Care – PPO | Admitting: Family Medicine

## 2013-06-15 ENCOUNTER — Other Ambulatory Visit (INDEPENDENT_AMBULATORY_CARE_PROVIDER_SITE_OTHER): Payer: BC Managed Care – PPO

## 2013-06-15 VITALS — BP 112/80 | HR 83 | Ht 66.0 in | Wt 191.0 lb

## 2013-06-15 DIAGNOSIS — M25569 Pain in unspecified knee: Secondary | ICD-10-CM

## 2013-06-15 DIAGNOSIS — M25562 Pain in left knee: Secondary | ICD-10-CM

## 2013-06-15 NOTE — Assessment & Plan Note (Signed)
Patellofemoral Syndrome  Reviewed anatomy using anatomical model and how PFS occurs.  Given rehab exercises handout for VMO, hip abductors, core, entire kinetic chain including proprioception exercises including cone touches, step downs, hip elevations and turn outs.  Could benefit from PT, regular exercise, upright biking, and a PFS knee brace to assist with tracking abnormalities if pain returns  Patients if she has pain return we will also do an ultrasound.  Patient will followup on an as-needed basis.

## 2013-06-15 NOTE — Progress Notes (Signed)
  [facility] [address], [place] [address] Phone: [phone] Subjective:    I'm seeing this patient by the request  of:  Annye Asa, MD   CC: Left knee pain  EPP:IRJJOACZYS Tracy Burch is a 33 y.o. female coming in with complaint of left knee pain. Patient states that she has had this pain for multiple months and maybe even a year. Patient does not remember any true injury. Patient was seen at urgent care and was diagnosed with a patellofemoral syndrome. Patient was doing some exercises which was starting to help improve it but does not notice any improvement with diclofenac. Patient though has been doing the home exercises and noticed that she has had considerable improvement. When the left knee was hurting it was mostly on the anterior portion of the knee it hurt more when going up or down stairs. Patient states there is some mild grinding sensation. Patient has been doing home exercises and has not had any of this type of pain. Patient is no longer taking ibuprofen as well. Patient has not tried any other home remedies. Patient states that her pain is only 2/10 at this time.     Past medical history, social, surgical and family history all reviewed in electronic medical record.   Review of Systems: No headache, visual changes, nausea, vomiting, diarrhea, constipation, dizziness, abdominal pain, skin rash, fevers, chills, night sweats, weight loss, swollen lymph nodes, body aches, joint swelling, muscle aches, chest pain, shortness of breath, mood changes.   Objective Blood pressure 112/80, pulse 83, height 5\' 6"  (1.676 m), weight 191 lb (86.637 kg), SpO2 98.00%.  General: No apparent distress alert and oriented x3 mood and affect normal, dressed appropriately.  HEENT: Pupils equal, extraocular movements intact  Respiratory: Patient's speak in full sentences and does not appear short of breath  Cardiovascular: No lower extremity edema, non  tender, no erythema  Skin: Warm dry intact with no signs of infection or rash on extremities or on axial skeleton.  Abdomen: Soft nontender  Neuro: Cranial nerves II through XII are intact, neurovascularly intact in all extremities with 2+ DTRs and 2+ pulses.  Lymph: No lymphadenopathy of posterior or anterior cervical chain or axillae bilaterally.  Gait normal with good balance and coordination.  MSK:  Non tender with full range of motion and good stability and symmetric strength and tone of shoulders, elbows, wrist, hip,  and ankles bilaterally.  Knee: Left Normal to inspection with no erythema or effusion or obvious bony abnormalities. Palpation normal with no warmth, joint line tenderness, patellar tenderness, or condyle tenderness. ROM full in flexion and extension and lower leg rotation. Ligaments with solid consistent endpoints including ACL, PCL, LCL, MCL. Negative Mcmurray's, Apley's, and Thessalonian tests. Non painful patellar compression. Patellar glide with mild crepitus. Positive J. tracking Patellar and quadriceps tendons unremarkable. Hamstring and quadriceps strength is normal. Still a little weak with the VMO Contralateral knee unremarkable     Impression and Recommendations:     This case required medical decision making of moderate complexity.

## 2013-06-15 NOTE — Patient Instructions (Signed)
Good to meet you Wear good shoes. Heat before activity and ice afterwards.  Continue exercises 3 times a week.  Biking would be great and wall sits are good.  If pain starts start ibuprofen 600mg  three times for 3 days.  If any worsening pain come back and see me.

## 2013-09-14 ENCOUNTER — Other Ambulatory Visit: Payer: Self-pay | Admitting: Family Medicine

## 2013-09-14 NOTE — Telephone Encounter (Signed)
Med filled.  

## 2013-10-27 ENCOUNTER — Ambulatory Visit (INDEPENDENT_AMBULATORY_CARE_PROVIDER_SITE_OTHER): Payer: BC Managed Care – PPO | Admitting: Family Medicine

## 2013-10-27 ENCOUNTER — Other Ambulatory Visit: Payer: Self-pay | Admitting: General Practice

## 2013-10-27 ENCOUNTER — Encounter: Payer: BC Managed Care – PPO | Admitting: Family Medicine

## 2013-10-27 ENCOUNTER — Encounter: Payer: Self-pay | Admitting: General Practice

## 2013-10-27 ENCOUNTER — Encounter: Payer: Self-pay | Admitting: Family Medicine

## 2013-10-27 VITALS — BP 122/80 | HR 91 | Temp 98.3°F | Resp 16 | Ht 66.0 in | Wt 194.2 lb

## 2013-10-27 DIAGNOSIS — Z0000 Encounter for general adult medical examination without abnormal findings: Secondary | ICD-10-CM

## 2013-10-27 LAB — CBC WITH DIFFERENTIAL/PLATELET
Basophils Absolute: 0 10*3/uL (ref 0.0–0.1)
Basophils Relative: 0.5 % (ref 0.0–3.0)
EOS PCT: 6.2 % — AB (ref 0.0–5.0)
Eosinophils Absolute: 0.4 10*3/uL (ref 0.0–0.7)
HCT: 34 % — ABNORMAL LOW (ref 36.0–46.0)
Hemoglobin: 10.9 g/dL — ABNORMAL LOW (ref 12.0–15.0)
LYMPHS PCT: 39.5 % (ref 12.0–46.0)
Lymphs Abs: 2.7 10*3/uL (ref 0.7–4.0)
MCHC: 32.2 g/dL (ref 30.0–36.0)
MCV: 80.7 fl (ref 78.0–100.0)
MONOS PCT: 4.5 % (ref 3.0–12.0)
Monocytes Absolute: 0.3 10*3/uL (ref 0.1–1.0)
NEUTROS PCT: 49.3 % (ref 43.0–77.0)
Neutro Abs: 3.4 10*3/uL (ref 1.4–7.7)
Platelets: 399 10*3/uL (ref 150.0–400.0)
RBC: 4.21 Mil/uL (ref 3.87–5.11)
RDW: 18.2 % — ABNORMAL HIGH (ref 11.5–15.5)
WBC: 6.8 10*3/uL (ref 4.0–10.5)

## 2013-10-27 LAB — BASIC METABOLIC PANEL
BUN: 9 mg/dL (ref 6–23)
CALCIUM: 9.2 mg/dL (ref 8.4–10.5)
CO2: 25 meq/L (ref 19–32)
CREATININE: 0.7 mg/dL (ref 0.4–1.2)
Chloride: 105 mEq/L (ref 96–112)
GFR: 123 mL/min (ref >60.00)
Glucose, Bld: 91 mg/dL (ref 70–99)
Potassium: 3.8 mEq/L (ref 3.5–5.1)
Sodium: 136 mEq/L (ref 135–145)

## 2013-10-27 LAB — HEPATIC FUNCTION PANEL
ALT: 12 U/L (ref 0–35)
AST: 13 U/L (ref 0–37)
Albumin: 3.4 g/dL — ABNORMAL LOW (ref 3.5–5.2)
Alkaline Phosphatase: 67 U/L (ref 39–117)
BILIRUBIN TOTAL: 0.3 mg/dL (ref 0.2–1.2)
Bilirubin, Direct: 0 mg/dL (ref 0.0–0.3)
TOTAL PROTEIN: 7.9 g/dL (ref 6.0–8.3)

## 2013-10-27 LAB — LIPID PANEL
Cholesterol: 190 mg/dL (ref 0–200)
HDL: 53.5 mg/dL (ref >39.00)
LDL Cholesterol: 123 mg/dL — ABNORMAL HIGH (ref 0–99)
NONHDL: 136.5
TRIGLYCERIDES: 69 mg/dL (ref 0.0–149.0)
Total CHOL/HDL Ratio: 4
VLDL: 13.8 mg/dL (ref 0.0–40.0)

## 2013-10-27 LAB — TSH: TSH: 0.59 u[IU]/mL (ref 0.35–4.50)

## 2013-10-27 LAB — VITAMIN D 25 HYDROXY (VIT D DEFICIENCY, FRACTURES): VITD: 28.45 ng/mL — AB (ref 30.00–100.00)

## 2013-10-27 MED ORDER — ZOLPIDEM TARTRATE 5 MG PO TABS: 5.0000 mg | ORAL_TABLET | Freq: Every evening | ORAL | Status: DC | PRN

## 2013-10-27 MED ORDER — VITAMIN D (ERGOCALCIFEROL) 1.25 MG (50000 UNIT) PO CAPS: 50000.0000 [IU] | ORAL_CAPSULE | ORAL | Status: DC

## 2013-10-27 MED ORDER — MONTELUKAST SODIUM 10 MG PO TABS: ORAL_TABLET | ORAL | Status: DC

## 2013-10-27 NOTE — Progress Notes (Signed)
Pre visit review using our clinic review tool, if applicable. No additional management support is needed unless otherwise documented below in the visit note. 

## 2013-10-27 NOTE — Progress Notes (Signed)
   Subjective:    Patient ID: Tracy Burch, female    DOB: Sep 13, 1980, 33 y.o.   MRN: [id number]  HPI CPE- UTD on GYN ([place]).  No concerns today.   Review of Systems Patient reports no vision/ hearing changes, adenopathy,fever, weight change,  persistant/recurrent hoarseness , swallowing issues, chest pain, palpitations, edema, persistant/recurrent cough, hemoptysis, dyspnea (rest/exertional/paroxysmal nocturnal), gastrointestinal bleeding (melena, rectal bleeding), abdominal pain, significant heartburn, bowel changes, GU symptoms (dysuria, hematuria, incontinence), Gyn symptoms (abnormal  bleeding, pain),  syncope, focal weakness, memory loss, numbness & tingling, skin/hair/nail changes, abnormal bruising or bleeding, anxiety, or depression.     Objective:   Physical Exam General Appearance:    Alert, cooperative, no distress, appears stated age  Head:    Normocephalic, without obvious abnormality, atraumatic  Eyes:    PERRL, conjunctiva/corneas clear, EOM's intact, fundi    benign, both eyes  Ears:    Normal TM's and external ear canals, both ears  Nose:   Nares normal, septum midline, mucosa normal, no drainage    or sinus tenderness  Throat:   Lips, mucosa, and tongue normal; teeth and gums normal  Neck:   Supple, symmetrical, trachea midline, no adenopathy;    Thyroid: no enlargement/tenderness/nodules  Back:     Symmetric, no curvature, ROM normal, no CVA tenderness  Lungs:     Clear to auscultation bilaterally, respirations unlabored  Chest Wall:    No tenderness or deformity   Heart:    Regular rate and rhythm, S1 and S2 normal, no murmur, rub   or gallop  Breast Exam:    Deferred to GYN  Abdomen:     Soft, non-tender, bowel sounds active all four quadrants,    no masses, no organomegaly  Genitalia:    Deferred to GYN  Rectal:    Extremities:   Extremities normal, atraumatic, no cyanosis or edema  Pulses:   2+ and symmetric all extremities  Skin:   Skin color,  texture, turgor normal, no rashes or lesions  Lymph nodes:   Cervical, supraclavicular, and axillary nodes normal  Neurologic:   CNII-XII intact, normal strength, sensation and reflexes    throughout          Assessment & Plan:

## 2013-10-27 NOTE — Patient Instructions (Signed)
Follow up in 1 year or as needed Keep up the good work!  You look great! We'll notify you of your lab results and make any changes if needed Call with any questions or concerns Enjoy the rest of your summer!!!

## 2013-10-27 NOTE — Assessment & Plan Note (Signed)
Pt's PE WNL.  UTD on GYN.  Check labs.  Anticipatory guidance provided.  

## 2013-12-08 ENCOUNTER — Ambulatory Visit (INDEPENDENT_AMBULATORY_CARE_PROVIDER_SITE_OTHER): Payer: BC Managed Care – PPO | Admitting: Internal Medicine

## 2013-12-08 ENCOUNTER — Encounter: Payer: Self-pay | Admitting: Internal Medicine

## 2013-12-08 VITALS — BP 128/68 | HR 83 | Temp 97.8°F | Wt 196.1 lb

## 2013-12-08 DIAGNOSIS — H9209 Otalgia, unspecified ear: Secondary | ICD-10-CM

## 2013-12-08 DIAGNOSIS — H9201 Otalgia, right ear: Secondary | ICD-10-CM

## 2013-12-08 MED ORDER — NEOMYCIN-POLYMYXIN-HC 3.5-10000-1 OT SUSP: 3.0000 [drp] | Freq: Four times a day (QID) | OTIC | Status: DC

## 2013-12-08 NOTE — Progress Notes (Signed)
Subjective:    Patient ID: Tracy Burch, female    DOB: January 15, 1981, 33 y.o.   MRN: [id number]  DOS:  12/08/2013 Type of visit - description : acute Interval history: Right year pain for the last 2 days, worse last night. No discharge or bleeding. She also noted some imbalance described as mild dizziness when she stands up here    ROS No fever, chills, runny nose or sore throat No nausea, vomiting, blood in the stools No headaches or double vision.   Past Medical History  Diagnosis Date  . Allergy   . Chicken pox   . UTI (urinary tract infection)   . Anemia   . Chlamydia     remote h/o    History reviewed. No pertinent past surgical history.  History   Social History  . Marital Status: Single    Spouse Name: N/A    Number of Children: N/A  . Years of Education: N/A   Occupational History  . Not on file.   Social History Main Topics  . Smoking status: Never Smoker   . Smokeless tobacco: Never Used  . Alcohol Use: Yes     Comment: ocassionally  . Drug Use: No  . Sexual Activity: Yes    Birth Control/ Protection: Pill     Comment: seasonique   Other Topics Concern  . Not on file   Social History Narrative  . No narrative on file        Medication List       This list is accurate as of: 12/08/13 11:59 PM.  Always use your most recent med list.               AMBULATORY NON FORMULARY MEDICATION  Place 600 mg vaginally 2 (two) times a week. Medication Name: Boric Acid Capsules 600 mg  1 pv twice weekly for 4 weeks then prn     ferrous sulfate 325 (65 FE) MG tablet  Take 1 tablet (325 mg total) by mouth daily with breakfast.     levonorgestrel-ethinyl estradiol 0.15-0.03 MG tablet  Commonly known as:  SEASONALE,INTROVALE,JOLESSA  Take 1 tablet by mouth daily.     montelukast 10 MG tablet  Commonly known as:  SINGULAIR  TAKE ONE TABLET BY MOUTH ONCE DAILY AT BEDTIME     neomycin-polymyxin-hydrocortisone 3.5-10000-1 otic suspension  Commonly  known as:  CORTISPORIN  Place 3 drops into the right ear 4 (four) times daily. As directed     Vitamin D (Ergocalciferol) 50000 UNITS Caps capsule  Commonly known as:  DRISDOL  Take 1 capsule (50,000 Units total) by mouth every 7 (seven) days.     zolpidem 5 MG tablet  Commonly known as:  AMBIEN  Take 1 tablet (5 mg total) by mouth at bedtime as needed for sleep.           Objective:   Physical Exam BP 128/68  Pulse 83  Temp(Src) 97.8 F (36.6 C) (Oral)  Wt 196 lb 2 oz (88.962 kg)  SpO2 97% General -- alert, well-developed, NAD.    HEENT-- Not pale.  L ear normal R ear:TM normal, canal without redness but when I introduced the othoscope she had some pain. No discharge or bleeding noted. Throat symmetric, no redness or discharge. Face symmetric, sinuses not tender to palpation. Nose not congested. Extremities-- no pretibial edema bilaterally  Neurologic--  alert & oriented X3. Speech normal, gait appropriate for age, strength symmetric and appropriate for age.  Romberg absent EOMI, PERLA  Psych-- Cognition and judgment appear intact. Cooperative with normal attention span and concentration. No anxious or depressed appearing.        Assessment & Plan:   Otalgia,  Pain and mild dizziness, neuro exam normal, mild pain at the ear canal on exam. Etiology of otalgia not completely clear. Plan: Empiric eardrops,rest, fluids, call if not improving soon

## 2013-12-08 NOTE — Progress Notes (Signed)
Pre visit review using our clinic review tool, if applicable. No additional management support is needed unless otherwise documented below in the visit note. 

## 2013-12-08 NOTE — Patient Instructions (Signed)
Use the eardrops as prescribed for 5 days Rest and drink plenty of fluids If you are not   progressively better in the next few days, please let us know

## 2014-01-30 ENCOUNTER — Encounter: Payer: Self-pay | Admitting: Internal Medicine

## 2014-04-04 IMAGING — CT CT ABD-PELV W/ CM
2 of 4 series · 16 of 46 positions shown, 18 images · IV contrast (APPLIED)
Comparison: None.

CLINICAL DATA: LEFT LOWER QUADRANT ABDOMINAL PAIN

EXAM:
CT ABDOMEN AND PELVIS WITH CONTRAST
TECHNIQUE: Multidetector CT imaging of the abdomen and pelvis was performed
using the standard protocol following bolus administration of
intravenous contrast.
CONTRAST:  100mL OMNIPAQUE IOHEXOL 300 MG/ML  SOLN

[Series 2: abd/pelvis 5.0 b31f · axial · 0.67mm/px · z∈[+878,+1278]mm · 13 of 88 slices shown, 15 images]
[im 4/88  soft-tissue]
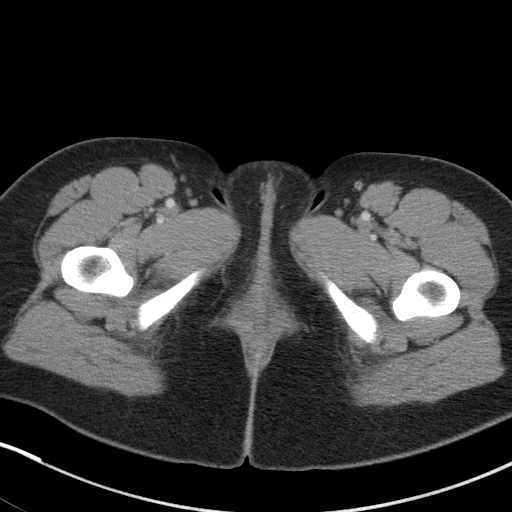
[im 4/88  bone]
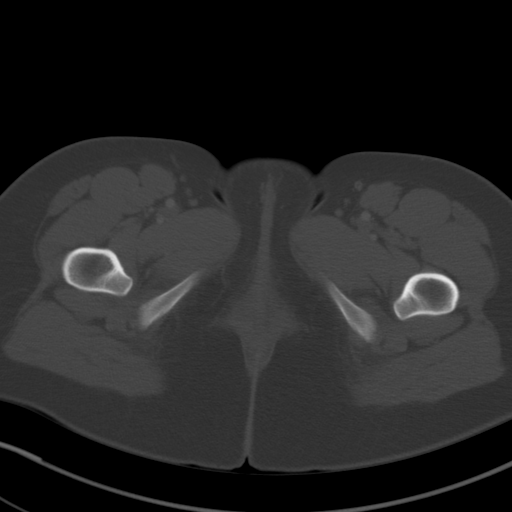
[im 11/88  soft-tissue]
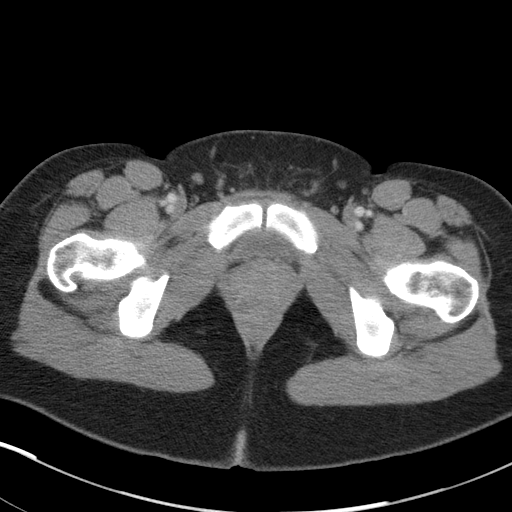
[im 17/88  soft-tissue]
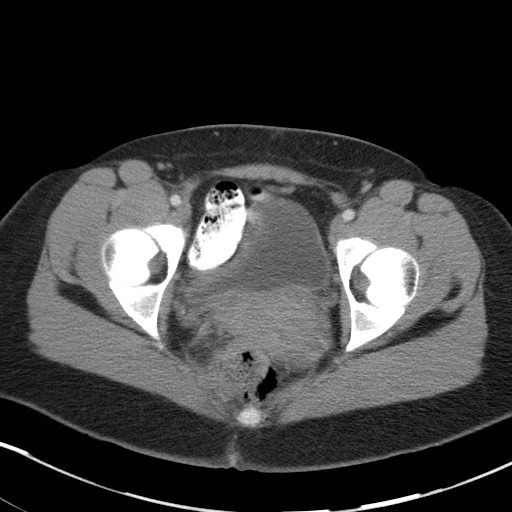
[im 24/88  soft-tissue]
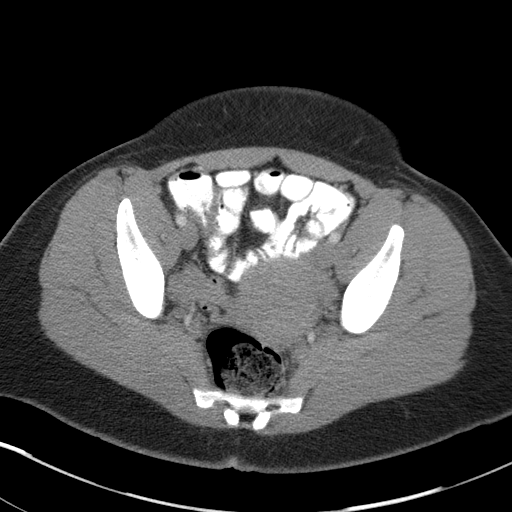
[im 31/88  soft-tissue]
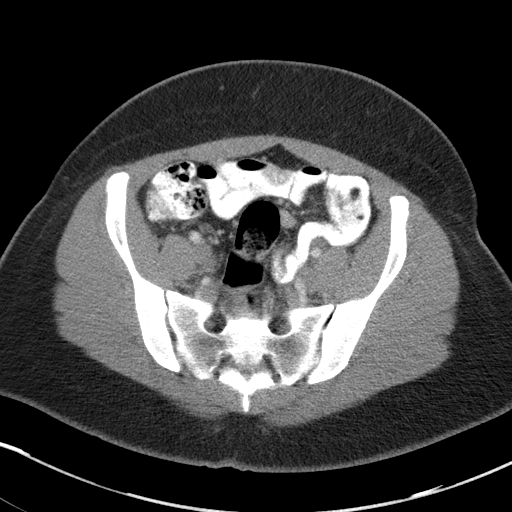
[im 37/88  soft-tissue]
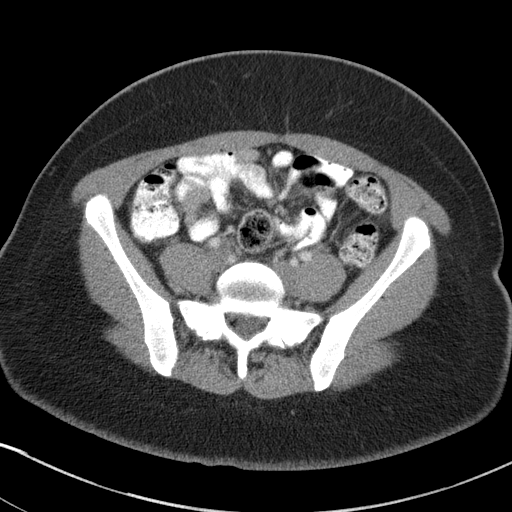
[im 44/88  soft-tissue]
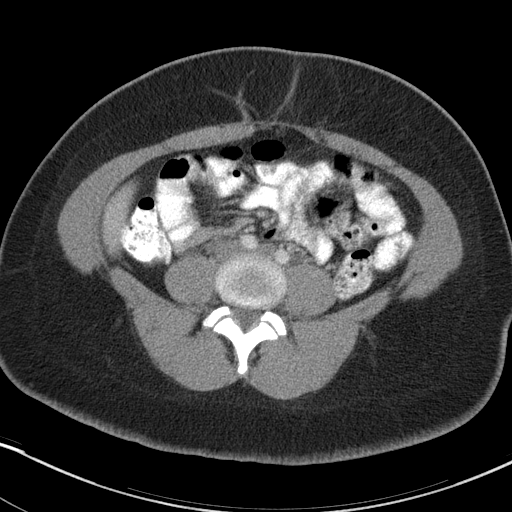
[im 51/88  soft-tissue]
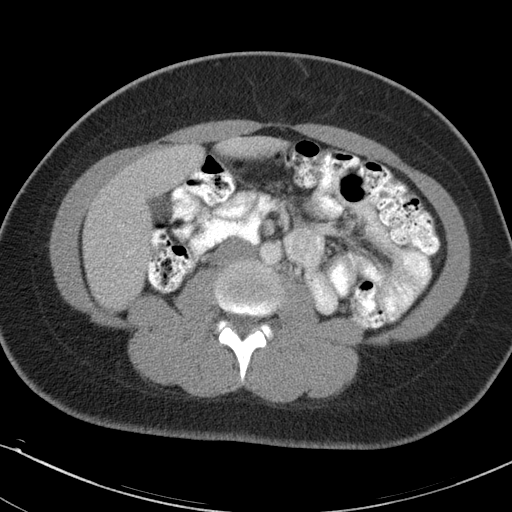
[im 57/88  soft-tissue]
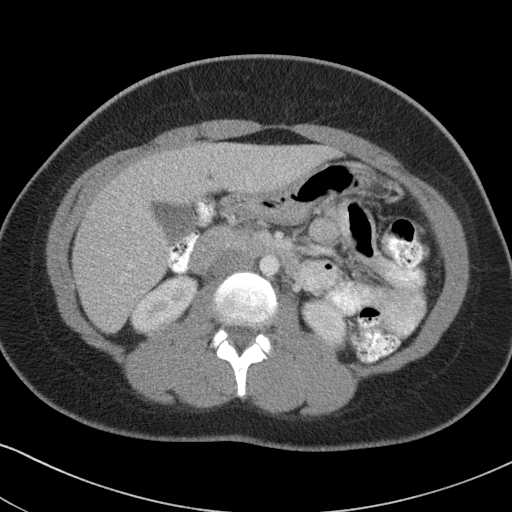
[im 57/88  bone]
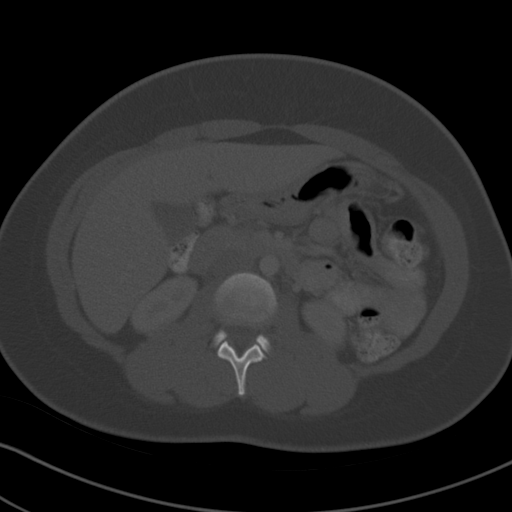
[im 64/88  soft-tissue]
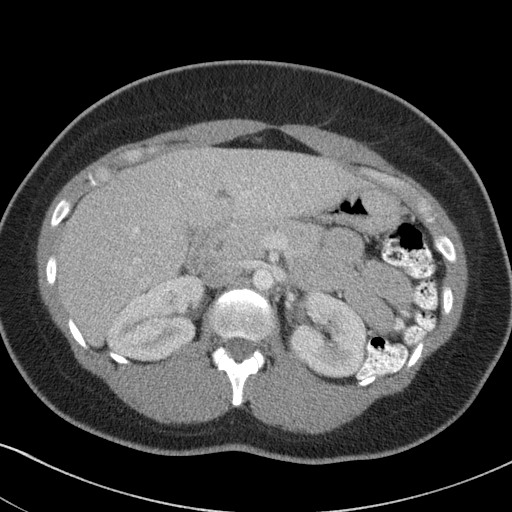
[im 71/88  soft-tissue]
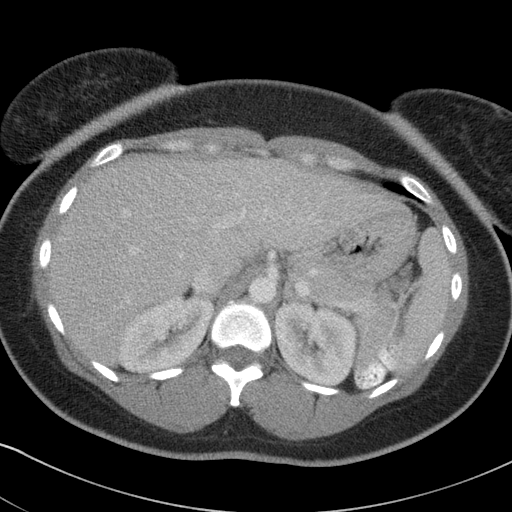
[im 77/88  soft-tissue]
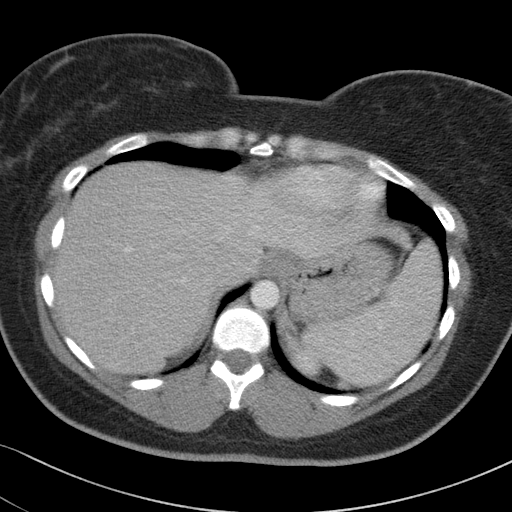
[im 84/88  soft-tissue]
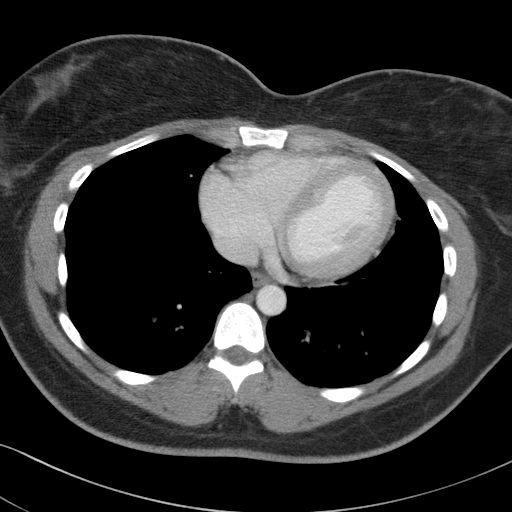

[Series 5: abd/pelvis 3.0 coronal · coronal · 0.82mm/px · 3 of 71 slices shown]
[im 24/71  soft-tissue]
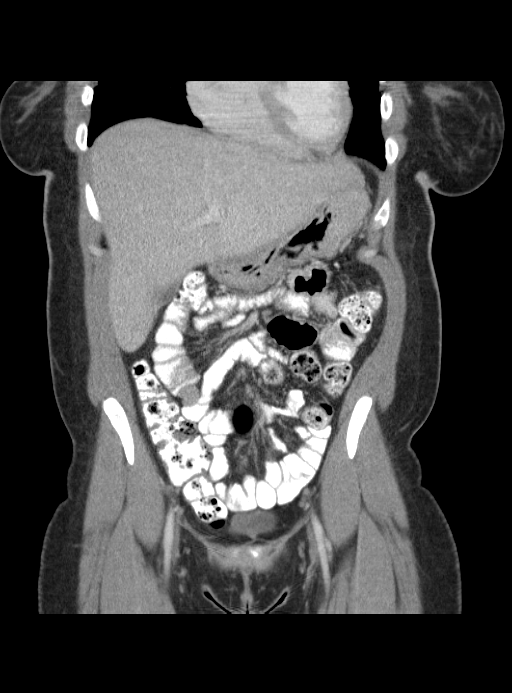
[im 32/71  soft-tissue]
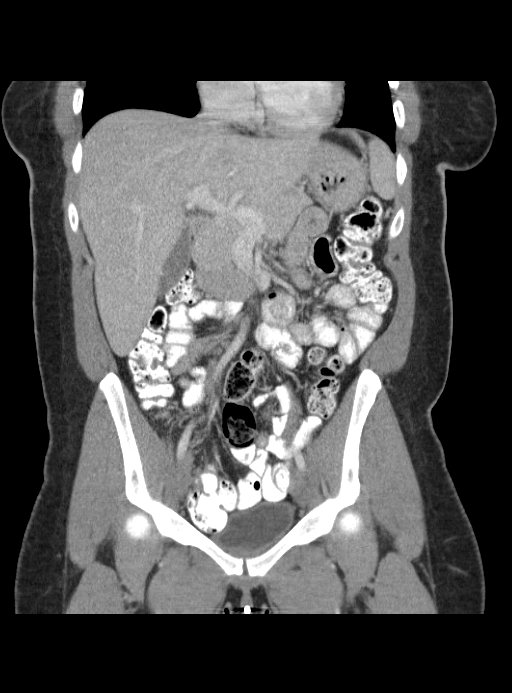
[im 39/71  soft-tissue]
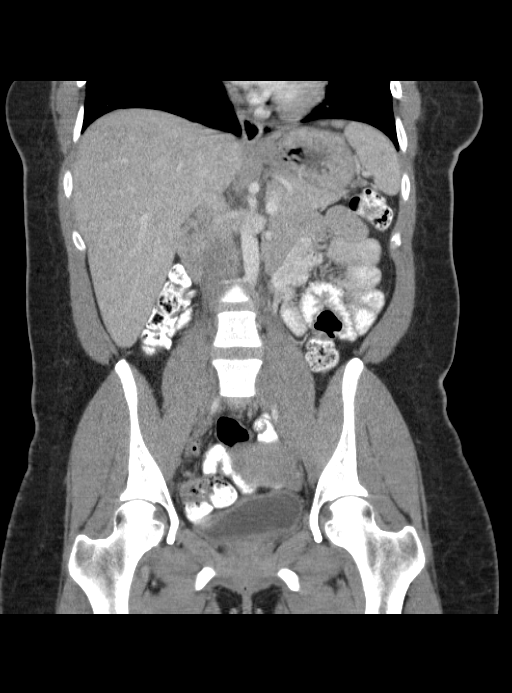

[16 of 46 positions shown; findings below may reference images not displayed]

FINDINGS: CLEAR LUNG BASES. NORMAL HEART SIZE. NO PERICARDIAL OR PLEURAL
EFFUSION. NO HIATAL HERNIA.

ABDOMEN: LIVER, GALLBLADDER, BILIARY SYSTEM, PANCREAS, SPLEEN,
ACCESSORY SPLENULE, ADRENAL GLANDS, AND KIDNEYS ARE WITHIN NORMAL
LIMITS FOR AGE AND DEMONSTRATE NO ACUTE PROCESS.

NO ABDOMINAL FREE FLUID, FLUID COLLECTION, HEMORRHAGE, ADENOPATHY OR
ABSCESS.

NEGATIVE FOR BOWEL OBSTRUCTION, DILATATION, ILEUS, OR FREE AIR.

PELVIS: PORTIONS OF THE APPENDIX ARE DEMONSTRATED AND APPEAR NORMAL.
NO PELVIC FREE FLUID, FLUID COLLECTION, HEMORRHAGE, ABSCESS,
ADENOPATHY, INGUINAL ABNORMALITY, OR HERNIA. URINARY BLADDER
UNREMARKABLE. UTERUS AND ADNEXAL NORMAL IN SIZE. NO ACUTE DISTAL
BOWEL PROCESS.

NO ACUTE OR ABNORMAL OSSEOUS FINDING
IMPRESSION: NO ACUTE INTRA-ABDOMINAL OR PELVIC FINDING.

## 2014-05-05 LAB — HM PAP SMEAR: HM Pap smear: NORMAL

## 2014-05-30 ENCOUNTER — Telehealth: Payer: Self-pay | Admitting: General Practice

## 2014-05-30 MED ORDER — ZOLPIDEM TARTRATE 5 MG PO TABS: 5.0000 mg | ORAL_TABLET | Freq: Every evening | ORAL | Status: DC | PRN

## 2014-05-30 NOTE — Telephone Encounter (Signed)
Last OV 10/27/13 ambien last filled 10/27/13 #30 with 3

## 2014-05-30 NOTE — Telephone Encounter (Signed)
Med filled.  

## 2014-05-30 NOTE — Telephone Encounter (Signed)
Ok for #30, 3 refills 

## 2014-06-15 ENCOUNTER — Ambulatory Visit (INDEPENDENT_AMBULATORY_CARE_PROVIDER_SITE_OTHER): Payer: BLUE CROSS/BLUE SHIELD | Admitting: Medical

## 2014-06-15 ENCOUNTER — Encounter: Payer: Self-pay | Admitting: Medical

## 2014-06-15 VITALS — BP 130/76 | HR 114 | Temp 98.7°F | Ht 66.0 in | Wt 189.4 lb

## 2014-06-15 DIAGNOSIS — B349 Viral infection, unspecified: Secondary | ICD-10-CM | POA: Insufficient documentation

## 2014-06-15 DIAGNOSIS — M791 Myalgia: Secondary | ICD-10-CM

## 2014-06-15 DIAGNOSIS — M609 Myositis, unspecified: Secondary | ICD-10-CM

## 2014-06-15 DIAGNOSIS — IMO0001 Reserved for inherently not codable concepts without codable children: Secondary | ICD-10-CM | POA: Insufficient documentation

## 2014-06-15 DIAGNOSIS — J029 Acute pharyngitis, unspecified: Secondary | ICD-10-CM | POA: Insufficient documentation

## 2014-06-15 MED ORDER — OSELTAMIVIR PHOSPHATE 75 MG PO CAPS: 75.0000 mg | ORAL_CAPSULE | Freq: Two times a day (BID) | ORAL | Status: DC

## 2014-06-15 NOTE — Assessment & Plan Note (Signed)
You appear to have the flu even though your  rapid flu test was negative.(Important to note sometimes flu test can be falsely negative.) Therefore,  I am treating you with tamiflu based on your clinical presentation. Rest, hydrate and take tylenol for fever. Alternate ibuprofen  if necessary for body ache or fever. You should gradually improve but  if you develop secondary bacterial infection signs and symptoms  please notify us so reevaluation or antibiotic can be prescribed.

## 2014-06-15 NOTE — Progress Notes (Signed)
Pre visit review using our clinic review tool, if applicable. No additional management support is needed unless otherwise documented below in the visit note. 

## 2014-06-15 NOTE — Assessment & Plan Note (Signed)
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 Other possible strep?  Will do both rapid flu and strep.

## 2014-06-15 NOTE — Progress Notes (Signed)
Subjective:    Patient ID: Tracy Burch, female    DOB: 1980/11/24, 34 y.o.   MRN: [id number]  HPI   Pt in states sick since yesterday. Pt states exposed to a lot flu with coworkers. Pt works at [employer]. Throat hurts, diffuse myalgias, feeling tired and fatigue. Mild ha last night. Subjective fever and chills.   LMP- 2 weeks ago.  Review of Systems  Constitutional: Positive for fever, chills and fatigue.  HENT: Positive for sore throat.   Cardiovascular: Negative for chest pain.  Gastrointestinal: Negative for vomiting, abdominal pain and constipation.  Musculoskeletal: Positive for myalgias.  Neurological: Positive for headaches. Negative for dizziness.       Faint ha last night.  Hematological: Negative for adenopathy. Does not bruise/bleed easily.  Psychiatric/Behavioral: Negative for behavioral problems and confusion.   Past Medical History  Diagnosis Date  . Allergy   . Chicken pox   . UTI (urinary tract infection)   . Anemia   . Chlamydia     remote h/o    History   Social History  . Marital Status: Single    Spouse Name: N/A  . Number of Children: N/A  . Years of Education: N/A   Occupational History  . Not on file.   Social History Main Topics  . Smoking status: Never Smoker   . Smokeless tobacco: Never Used  . Alcohol Use: Yes     Comment: ocassionally  . Drug Use: No  . Sexual Activity: Yes    Birth Control/ Protection: Pill     Comment: seasonique   Other Topics Concern  . Not on file   Social History Narrative    No past surgical history on file.  No family history on file.  No Known Allergies  Current Outpatient Prescriptions on File Prior to Visit  Medication Sig Dispense Refill  . AMBULATORY NON FORMULARY MEDICATION Place 600 mg vaginally 2 (two) times a week. Medication Name: Boric Acid Capsules 600 mg  1 pv twice weekly for 4 weeks then prn 30 suppository 11  . levonorgestrel-ethinyl estradiol  (SEASONALE,INTROVALE,JOLESSA) 0.15-0.03 MG tablet Take 1 tablet by mouth daily. 1 Package 11  . montelukast (SINGULAIR) 10 MG tablet TAKE ONE TABLET BY MOUTH ONCE DAILY AT BEDTIME 30 tablet 6  . zolpidem (AMBIEN) 5 MG tablet Take 1 tablet (5 mg total) by mouth at bedtime as needed for sleep. 30 tablet 3  . ferrous sulfate 325 (65 FE) MG tablet Take 1 tablet (325 mg total) by mouth daily with breakfast. 30 tablet 5   No current facility-administered medications on file prior to visit.    BP 130/76 mmHg  Pulse 114  Temp(Src) 98.7 F (37.1 C) (Oral)  Ht 5\' 6"  (1.676 m)  Wt 189 lb 6.4 oz (85.911 kg)  BMI 30.58 kg/m2  SpO2 100%  LMP 06/01/2014      Objective:   Physical Exam  General  Mental Status - Alert. General Appearance - Well groomed. Not in acute distress.  Skin Rashes- No Rashes.  HEENT Head- Normal. Ear Auditory Canal - Left- Normal. Right - Normal.Tympanic Membrane- Left- Normal. Right- Normal. Eye Sclera/Conjunctiva- Left- Normal. Right- Normal. Nose & Sinuses Nasal Mucosa- Left-  Mild boggy + Congested. Right-  Mild  boggy + Congested. Mouth & Throat Lips: Upper Lip- Normal: no dryness, cracking, pallor, cyanosis, or vesicular eruption. Lower Lip-Normal: no dryness, cracking, pallor, cyanosis or vesicular eruption. Buccal Mucosa- Bilateral- No Aphthous ulcers. Oropharynx- No Discharge or Erythema. Tonsils:  Characteristics- Bilateral- Mild  Erythema + Congestion. Size/Enlargement- Bilateral- No enlargement. Discharge- bilateral-None.  Neck Neck- Supple. No Masses.   Chest and Lung Exam Auscultation: Breath Sounds:- even and unlabored  Cardiovascular Auscultation:Rythm- Regular, rate and rhythm. Murmurs & Other Heart Sounds:Ausculatation of the heart reveal- No Murmurs.  Lymphatic Head & Neck General Head & Neck Lymphatics: Bilateral: Description- No Localized lymphadenopathy.       Assessment & Plan:

## 2014-06-15 NOTE — Patient Instructions (Signed)
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 Other possible strep?  Will do both rapid flu and strep.   Viral syndrome You appear to have the flu even though your  rapid flu test was negative.(Important to note sometimes flu test can be falsely negative.) Therefore,  I am treating you with tamiflu based on your clinical presentation. Rest, hydrate and take tylenol for fever. Alternate ibuprofen  if necessary for body ache or fever. You should gradually improve but  if you develop secondary bacterial infection signs and symptoms  please notify us so reevaluation or antibiotic can be prescribed.      Rapid strep test was negative as well.  Follow up in 7 days or as needed.

## 2014-09-25 ENCOUNTER — Other Ambulatory Visit: Payer: Self-pay | Admitting: General Practice

## 2014-09-25 MED ORDER — MONTELUKAST SODIUM 10 MG PO TABS: ORAL_TABLET | ORAL | Status: DC

## 2014-10-17 ENCOUNTER — Telehealth: Payer: Self-pay | Admitting: Family Medicine

## 2014-10-17 NOTE — Telephone Encounter (Signed)
pre visit letter mailed 10/16/14

## 2014-11-03 ENCOUNTER — Telehealth: Payer: Self-pay | Admitting: Behavioral Health

## 2014-11-03 NOTE — Telephone Encounter (Signed)
Appointment confirmed with patient for 11/06/14 at 8:00 AM.

## 2014-11-06 ENCOUNTER — Encounter: Payer: Self-pay | Admitting: Family Medicine

## 2014-11-06 ENCOUNTER — Ambulatory Visit (INDEPENDENT_AMBULATORY_CARE_PROVIDER_SITE_OTHER): Payer: BLUE CROSS/BLUE SHIELD | Admitting: Family Medicine

## 2014-11-06 ENCOUNTER — Other Ambulatory Visit (INDEPENDENT_AMBULATORY_CARE_PROVIDER_SITE_OTHER): Payer: BLUE CROSS/BLUE SHIELD

## 2014-11-06 ENCOUNTER — Other Ambulatory Visit: Payer: Self-pay | Admitting: General Practice

## 2014-11-06 VITALS — BP 110/80 | HR 76 | Temp 98.2°F | Resp 16 | Ht 66.0 in | Wt 194.4 lb

## 2014-11-06 DIAGNOSIS — R7309 Other abnormal glucose: Secondary | ICD-10-CM

## 2014-11-06 DIAGNOSIS — Z0000 Encounter for general adult medical examination without abnormal findings: Secondary | ICD-10-CM

## 2014-11-06 DIAGNOSIS — R7303 Prediabetes: Secondary | ICD-10-CM

## 2014-11-06 LAB — CBC WITH DIFFERENTIAL/PLATELET
BASOS PCT: 2.8 % (ref 0.0–3.0)
Basophils Absolute: 0.2 10*3/uL — ABNORMAL HIGH (ref 0.0–0.1)
EOS PCT: 6.3 % — AB (ref 0.0–5.0)
Eosinophils Absolute: 0.5 10*3/uL (ref 0.0–0.7)
HEMATOCRIT: 34.2 % — AB (ref 36.0–46.0)
HEMOGLOBIN: 11 g/dL — AB (ref 12.0–15.0)
LYMPHS PCT: 40.1 % (ref 12.0–46.0)
Lymphs Abs: 3 10*3/uL (ref 0.7–4.0)
MCHC: 32.3 g/dL (ref 30.0–36.0)
MCV: 82.1 fl (ref 78.0–100.0)
MONO ABS: 0.3 10*3/uL (ref 0.1–1.0)
Monocytes Relative: 4.4 % (ref 3.0–12.0)
Neutro Abs: 3.4 10*3/uL (ref 1.4–7.7)
Neutrophils Relative %: 46.4 % (ref 43.0–77.0)
Platelets: 366 10*3/uL (ref 150.0–400.0)
RBC: 4.16 Mil/uL (ref 3.87–5.11)
RDW: 15.9 % — ABNORMAL HIGH (ref 11.5–15.5)
WBC: 7.4 10*3/uL (ref 4.0–10.5)

## 2014-11-06 LAB — BASIC METABOLIC PANEL
BUN: 7 mg/dL (ref 6–23)
CO2: 26 mEq/L (ref 19–32)
CREATININE: 0.69 mg/dL (ref 0.40–1.20)
Calcium: 9.2 mg/dL (ref 8.4–10.5)
Chloride: 103 mEq/L (ref 96–112)
GFR: 126.3 mL/min (ref >60.00)
GLUCOSE: 112 mg/dL — AB (ref 70–99)
POTASSIUM: 3.8 meq/L (ref 3.5–5.1)
SODIUM: 136 meq/L (ref 135–145)

## 2014-11-06 LAB — VITAMIN D 25 HYDROXY (VIT D DEFICIENCY, FRACTURES): VITD: 22.47 ng/mL — AB (ref 30.00–100.00)

## 2014-11-06 LAB — LIPID PANEL
CHOL/HDL RATIO: 4
CHOLESTEROL: 178 mg/dL (ref 0–200)
HDL: 48.5 mg/dL (ref >39.00)
LDL Cholesterol: 117 mg/dL — ABNORMAL HIGH (ref 0–99)
NONHDL: 129.49
Triglycerides: 64 mg/dL (ref 0.0–149.0)
VLDL: 12.8 mg/dL (ref 0.0–40.0)

## 2014-11-06 LAB — HEPATIC FUNCTION PANEL
ALT: 10 U/L (ref 0–35)
AST: 13 U/L (ref 0–37)
Albumin: 3.6 g/dL (ref 3.5–5.2)
Alkaline Phosphatase: 62 U/L (ref 39–117)
BILIRUBIN DIRECT: 0.1 mg/dL (ref 0.0–0.3)
BILIRUBIN TOTAL: 0.4 mg/dL (ref 0.2–1.2)
Total Protein: 7.5 g/dL (ref 6.0–8.3)

## 2014-11-06 LAB — TSH: TSH: 1.69 u[IU]/mL (ref 0.35–4.50)

## 2014-11-06 LAB — HEMOGLOBIN A1C: Hgb A1c MFr Bld: 6.1 % (ref 4.6–6.5)

## 2014-11-06 MED ORDER — VITAMIN D (ERGOCALCIFEROL) 1.25 MG (50000 UNIT) PO CAPS: 50000.0000 [IU] | ORAL_CAPSULE | ORAL | Status: DC

## 2014-11-06 NOTE — Progress Notes (Signed)
Pre visit review using our clinic review tool, if applicable. No additional management support is needed unless otherwise documented below in the visit note. 

## 2014-11-06 NOTE — Assessment & Plan Note (Signed)
Pt's PE WNL.  UTD on GYN.  Encouraged healthy diet and regular exercise as pt has gained 5 lbs in last year.  Check labs.  Anticipatory guidance provided.

## 2014-11-06 NOTE — Progress Notes (Signed)
   Subjective:    Patient ID: Tracy Burch, female    DOB: [birth year], 34 y.o.   MRN: [id number]  HPI CPE- UTD on GYN.  No concerns today   Review of Systems Patient reports no vision/ hearing changes, adenopathy,fever, weight change,  persistant/recurrent hoarseness , swallowing issues, chest pain, palpitations, edema, persistant/recurrent cough, hemoptysis, dyspnea (rest/exertional/paroxysmal nocturnal), gastrointestinal bleeding (melena, rectal bleeding), abdominal pain, significant heartburn, bowel changes, GU symptoms (dysuria, hematuria, incontinence), Gyn symptoms (abnormal  bleeding, pain),  syncope, focal weakness, memory loss, numbness & tingling, skin/hair/nail changes, abnormal bruising or bleeding, anxiety, or depression.     Objective:   Physical Exam General Appearance:    Alert, cooperative, no distress, appears stated age  Head:    Normocephalic, without obvious abnormality, atraumatic  Eyes:    PERRL, conjunctiva/corneas clear, EOM's intact, fundi    benign, both eyes  Ears:    Normal TM's and external ear canals, both ears  Nose:   Nares normal, septum midline, mucosa normal, no drainage    or sinus tenderness  Throat:   Lips, mucosa, and tongue normal; teeth and gums normal  Neck:   Supple, symmetrical, trachea midline, no adenopathy;    Thyroid: no enlargement/tenderness/nodules  Back:     Symmetric, no curvature, ROM normal, no CVA tenderness  Lungs:     Clear to auscultation bilaterally, respirations unlabored  Chest Wall:    No tenderness or deformity   Heart:    Regular rate and rhythm, S1 and S2 normal, no murmur, rub   or gallop  Breast Exam:    Deferred to GYN  Abdomen:     Soft, non-tender, bowel sounds active all four quadrants,    no masses, no organomegaly  Genitalia:    Deferred to GYN  Rectal:    Extremities:   Extremities normal, atraumatic, no cyanosis or edema  Pulses:   2+ and symmetric all extremities  Skin:   Skin color, texture, turgor  normal, no rashes or lesions  Lymph nodes:   Cervical, supraclavicular, and axillary nodes normal  Neurologic:   CNII-XII intact, normal strength, sensation and reflexes    throughout          Assessment & Plan:

## 2014-11-06 NOTE — Patient Instructions (Signed)
Follow up in 1 year or as needed We'll notify you of your lab results and make any changes if needed Continue to work on healthy diet and regular exercise Call with any questions or concerns Enjoy the rest of your summer!!!

## 2015-01-02 ENCOUNTER — Other Ambulatory Visit: Payer: Self-pay | Admitting: General Practice

## 2015-01-02 MED ORDER — ZOLPIDEM TARTRATE 5 MG PO TABS: 5.0000 mg | ORAL_TABLET | Freq: Every evening | ORAL | Status: DC | PRN

## 2015-01-02 NOTE — Telephone Encounter (Signed)
Medication filled to pharmacy as requested.   

## 2015-01-02 NOTE — Telephone Encounter (Signed)
Last OV 11/06/14 Zolpidem last filled 05/30/14 #30 with 3

## 2015-05-15 LAB — HM PAP SMEAR

## 2015-05-17 ENCOUNTER — Encounter: Payer: Self-pay | Admitting: General Practice

## 2015-06-13 ENCOUNTER — Telehealth: Payer: Self-pay | Admitting: Family Medicine

## 2015-06-13 NOTE — Telephone Encounter (Signed)
Pt chart is current for flu vac.

## 2015-10-16 ENCOUNTER — Other Ambulatory Visit: Payer: Self-pay | Admitting: Family Medicine

## 2015-10-17 NOTE — Telephone Encounter (Signed)
Medication filled to pharmacy as requested.   

## 2015-11-09 ENCOUNTER — Encounter: Payer: Self-pay | Admitting: Family Medicine

## 2015-11-09 ENCOUNTER — Ambulatory Visit (INDEPENDENT_AMBULATORY_CARE_PROVIDER_SITE_OTHER): Payer: BLUE CROSS/BLUE SHIELD | Admitting: Family Medicine

## 2015-11-09 VITALS — BP 110/78 | HR 69 | Temp 98.1°F | Resp 16 | Ht 66.0 in | Wt 199.5 lb

## 2015-11-09 DIAGNOSIS — Z0000 Encounter for general adult medical examination without abnormal findings: Secondary | ICD-10-CM

## 2015-11-09 LAB — LIPID PANEL
CHOL/HDL RATIO: 3
Cholesterol: 180 mg/dL (ref 0–200)
HDL: 53.7 mg/dL (ref >39.00)
LDL CALC: 113 mg/dL — AB (ref 0–99)
NonHDL: 126.32
TRIGLYCERIDES: 65 mg/dL (ref 0.0–149.0)
VLDL: 13 mg/dL (ref 0.0–40.0)

## 2015-11-09 LAB — VITAMIN D 25 HYDROXY (VIT D DEFICIENCY, FRACTURES): VITD: 21.99 ng/mL — ABNORMAL LOW (ref 30.00–100.00)

## 2015-11-09 LAB — CBC WITH DIFFERENTIAL/PLATELET
Basophils Absolute: 0.3 10*3/uL — ABNORMAL HIGH (ref 0.0–0.1)
Basophils Relative: 3.4 % — ABNORMAL HIGH (ref 0.0–3.0)
EOS PCT: 4.1 % (ref 0.0–5.0)
Eosinophils Absolute: 0.3 10*3/uL (ref 0.0–0.7)
HCT: 34.6 % — ABNORMAL LOW (ref 36.0–46.0)
HEMOGLOBIN: 11.2 g/dL — AB (ref 12.0–15.0)
LYMPHS PCT: 39.5 % (ref 12.0–46.0)
Lymphs Abs: 3.1 10*3/uL (ref 0.7–4.0)
MCHC: 32.3 g/dL (ref 30.0–36.0)
MCV: 82.2 fl (ref 78.0–100.0)
MONOS PCT: 4 % (ref 3.0–12.0)
Monocytes Absolute: 0.3 10*3/uL (ref 0.1–1.0)
NEUTROS PCT: 49 % (ref 43.0–77.0)
Neutro Abs: 3.8 10*3/uL (ref 1.4–7.7)
Platelets: 377 10*3/uL (ref 150.0–400.0)
RBC: 4.21 Mil/uL (ref 3.87–5.11)
RDW: 16.1 % — ABNORMAL HIGH (ref 11.5–15.5)
WBC: 7.8 10*3/uL (ref 4.0–10.5)

## 2015-11-09 LAB — HEPATIC FUNCTION PANEL
ALK PHOS: 71 U/L (ref 39–117)
ALT: 10 U/L (ref 0–35)
AST: 13 U/L (ref 0–37)
Albumin: 3.8 g/dL (ref 3.5–5.2)
Bilirubin, Direct: 0.1 mg/dL (ref 0.0–0.3)
TOTAL PROTEIN: 7.5 g/dL (ref 6.0–8.3)
Total Bilirubin: 0.3 mg/dL (ref 0.2–1.2)

## 2015-11-09 LAB — BASIC METABOLIC PANEL
BUN: 11 mg/dL (ref 6–23)
CALCIUM: 9.7 mg/dL (ref 8.4–10.5)
CO2: 29 mEq/L (ref 19–32)
CREATININE: 0.65 mg/dL (ref 0.40–1.20)
Chloride: 104 mEq/L (ref 96–112)
GFR: 134.49 mL/min (ref >60.00)
GLUCOSE: 103 mg/dL — AB (ref 70–99)
POTASSIUM: 4.2 meq/L (ref 3.5–5.1)
Sodium: 138 mEq/L (ref 135–145)

## 2015-11-09 LAB — TSH: TSH: 1.06 u[IU]/mL (ref 0.35–4.50)

## 2015-11-09 NOTE — Progress Notes (Signed)
Pre visit review using our clinic review tool, if applicable. No additional management support is needed unless otherwise documented below in the visit note. 

## 2015-11-09 NOTE — Patient Instructions (Signed)
Follow up in 1 year or as needed We'll notify you of your lab results and make any changes if needed Keep up the good work on healthy diet and regular exercise- you can do it!!! Call with any questions or concerns [phone]!!!

## 2015-11-09 NOTE — Assessment & Plan Note (Signed)
Pt's PE WNL.  UTD on pap and Tdap.  Check labs.  Anticipatory guidance provided.

## 2015-11-09 NOTE — Progress Notes (Signed)
   Subjective:    Patient ID: Tracy Burch, female    DOB: Mar 09, 1981, 35 y.o.   MRN: [id number]  HPI CPE- UTD on GYN.  UTD Tdap.   Review of Systems Patient reports no vision/ hearing changes, adenopathy,fever, weight change,  persistant/recurrent hoarseness , swallowing issues, chest pain, palpitations, edema, persistant/recurrent cough, hemoptysis, dyspnea (rest/exertional/paroxysmal nocturnal), gastrointestinal bleeding (melena, rectal bleeding), abdominal pain, significant heartburn, bowel changes, GU symptoms (dysuria, hematuria, incontinence), Gyn symptoms (abnormal  bleeding, pain),  syncope, focal weakness, memory loss, numbness & tingling, skin/hair/nail changes, abnormal bruising or bleeding, anxiety, or depression.     Objective:   Physical Exam General Appearance:    Alert, cooperative, no distress, appears stated age  Head:    Normocephalic, without obvious abnormality, atraumatic  Eyes:    PERRL, conjunctiva/corneas clear, EOM's intact, fundi    benign, both eyes  Ears:    Normal TM's and external ear canals, both ears  Nose:   Nares normal, septum midline, mucosa normal, no drainage    or sinus tenderness  Throat:   Lips, mucosa, and tongue normal; teeth and gums normal  Neck:   Supple, symmetrical, trachea midline, no adenopathy;    Thyroid: no enlargement/tenderness/nodules  Back:     Symmetric, no curvature, ROM normal, no CVA tenderness  Lungs:     Clear to auscultation bilaterally, respirations unlabored  Chest Wall:    No tenderness or deformity   Heart:    Regular rate and rhythm, S1 and S2 normal, no murmur, rub   or gallop  Breast Exam:    Deferred to GYN  Abdomen:     Soft, non-tender, bowel sounds active all four quadrants,    no masses, no organomegaly  Genitalia:    Deferred to GYN  Rectal:    Extremities:   Extremities normal, atraumatic, no cyanosis or edema  Pulses:   2+ and symmetric all extremities  Skin:   Skin color, texture, turgor normal, no  rashes or lesions  Lymph nodes:   Cervical, supraclavicular, and axillary nodes normal  Neurologic:   CNII-XII intact, normal strength, sensation and reflexes    throughout          Assessment & Plan:

## 2015-11-12 ENCOUNTER — Other Ambulatory Visit: Payer: Self-pay | Admitting: General Practice

## 2015-11-12 MED ORDER — VITAMIN D (ERGOCALCIFEROL) 1.25 MG (50000 UNIT) PO CAPS
50000.0000 [IU] | ORAL_CAPSULE | ORAL | 0 refills | Status: DC
Start: 2015-11-12 — End: 2016-03-10

## 2015-11-12 NOTE — Progress Notes (Signed)
Called pt and lmovm to return call.

## 2015-12-12 ENCOUNTER — Other Ambulatory Visit: Payer: Self-pay | Admitting: General Practice

## 2015-12-12 MED ORDER — ZOLPIDEM TARTRATE 5 MG PO TABS: 5.0000 mg | ORAL_TABLET | Freq: Every evening | ORAL | 3 refills | Status: DC | PRN

## 2015-12-12 NOTE — Telephone Encounter (Signed)
Medication filled to pharmacy as requested.   

## 2015-12-12 NOTE — Telephone Encounter (Signed)
Last OV 11/09/15 Zolpidem last filled 01/02/15 #30 with 3

## 2016-01-11 ENCOUNTER — Ambulatory Visit: Payer: BLUE CROSS/BLUE SHIELD | Admitting: Family Medicine

## 2016-03-10 ENCOUNTER — Ambulatory Visit (INDEPENDENT_AMBULATORY_CARE_PROVIDER_SITE_OTHER): Payer: BLUE CROSS/BLUE SHIELD | Admitting: Family Medicine

## 2016-03-10 ENCOUNTER — Encounter: Payer: Self-pay | Admitting: Family Medicine

## 2016-03-10 VITALS — BP 121/80 | HR 56 | Temp 98.1°F | Resp 16 | Ht 66.0 in | Wt 193.0 lb

## 2016-03-10 DIAGNOSIS — M25561 Pain in right knee: Secondary | ICD-10-CM

## 2016-03-10 MED ORDER — MELOXICAM 15 MG PO TABS: 15.0000 mg | ORAL_TABLET | Freq: Every day | ORAL | 1 refills | Status: DC

## 2016-03-10 NOTE — Progress Notes (Signed)
Pre visit review using our clinic review tool, if applicable. No additional management support is needed unless otherwise documented below in the visit note. 

## 2016-03-10 NOTE — Patient Instructions (Signed)
Follow up as needed Start the Mobic once daily for pain and inflammation- take w/ food We'll call you with your ortho appt ICE when you are sitting Get a neoprene knee brace w/ an oval cutout to keep the kneecap in position Call with any questions or concerns Hang in there! [place]!!!

## 2016-03-10 NOTE — Progress Notes (Signed)
   Subjective:    Patient ID: Tracy Burch, female    DOB: 02-23-1981, 35 y.o.   MRN: [id number]  HPI R knee pain- sxs started ~1 week ago.  No longer wants to walk up stairs and is debating sleeping on the couch to avoid doing so.  Sensation that knee is going to pop when she fully extends it.  No known injury.  Walks regularly but no recent falls or change in activity level.  No swelling.  Pain at top of knee cap and w/ movement of patella.     Review of Systems For ROS see HPI     Objective:   Physical Exam  Constitutional: She is oriented to person, place, and time. She appears well-developed and well-nourished. No distress.  HENT:  Head: Normocephalic and atraumatic.  Cardiovascular: Intact distal pulses.   Musculoskeletal: She exhibits tenderness (TTP over R quad tendon superior to patella.  + patellar grind.  No joint line TTP.  pain w/ full extension of R knee). She exhibits no edema or deformity.  Neurological: She is alert and oriented to person, place, and time. She has normal reflexes. Coordination normal.  Skin: Skin is warm and dry. No erythema.  Psychiatric: She has a normal mood and affect. Her behavior is normal. Thought content normal.  Vitals reviewed.         Assessment & Plan:  R knee pain- new.  Differential includes patello-femoral syndrome, chondromalacia of the patella, quad tendonitis vs other.  Start scheduled NSAIDs.  Pt has upcoming trip to [place] and will be doing a lot of walking.  Refer to ortho for complete evaluation and tx.  Pt expressed understanding and is in agreement w/ plan.

## 2016-03-13 ENCOUNTER — Ambulatory Visit (INDEPENDENT_AMBULATORY_CARE_PROVIDER_SITE_OTHER): Payer: Self-pay | Admitting: Orthopaedic Surgery

## 2016-03-19 ENCOUNTER — Ambulatory Visit (INDEPENDENT_AMBULATORY_CARE_PROVIDER_SITE_OTHER): Payer: BLUE CROSS/BLUE SHIELD | Admitting: Orthopedic Surgery

## 2016-03-19 ENCOUNTER — Encounter (INDEPENDENT_AMBULATORY_CARE_PROVIDER_SITE_OTHER): Payer: Self-pay | Admitting: Orthopedic Surgery

## 2016-03-19 ENCOUNTER — Ambulatory Visit (INDEPENDENT_AMBULATORY_CARE_PROVIDER_SITE_OTHER): Payer: Self-pay

## 2016-03-19 VITALS — BP 149/87 | HR 86 | Resp 12 | Ht 65.0 in | Wt 196.0 lb

## 2016-03-19 DIAGNOSIS — G8929 Other chronic pain: Secondary | ICD-10-CM

## 2016-03-19 DIAGNOSIS — M25561 Pain in right knee: Secondary | ICD-10-CM | POA: Diagnosis not present

## 2016-03-19 MED ORDER — DICLOFENAC SODIUM 50 MG PO TBEC: 50.0000 mg | DELAYED_RELEASE_TABLET | Freq: Two times a day (BID) | ORAL | 1 refills | Status: DC

## 2016-03-19 NOTE — Progress Notes (Signed)
Office Visit Note   Patient: Tracy Burch           Date of Birth: Aug 06, 1980           MRN: [id number] Visit Date: 03/19/2016              Requested by: Midge Minium, MD [name] [place], [place] [address] PCP: Annye Asa, MD   Assessment & Plan: Visit Diagnoses:  1. Chronic pain of right knee     Plan:  #1: Stop Mobic and begin Voltaren as prescribed #2: Physical therapy for VMO strengthening generalized knee strengthening #3: She will obtain a Shields type brace from [facility]. We have shown her what we have here and if she so desires and does not like the one at [facility] she can take the one from here #4: She will obtain a power step roll shoe insert from the Internet #5: Follow back up in 2 weeks for recheck evaluation.  Follow-Up Instructions: Return in about 2 weeks (around 04/02/2016).   Orders:  Orders Placed This Encounter  Procedures  . XR KNEE 3 VIEW RIGHT  . Ambulatory referral to Physical Therapy   Meds ordered this encounter  Medications  . diclofenac (VOLTAREN) 50 MG EC tablet    Sig: Take 1 tablet (50 mg total) by mouth 2 (two) times daily. DO NOT TAKE WITH IBUPROFEN, NAPROXEN, MOBIC, ETC    Dispense:  60 tablet    Refill:  1    Order Specific Question:   Supervising Provider    Answer:   Garald Balding [id number]      Procedures: No procedures performed   Clinical Data: No additional findings.   Subjective: Chief Complaint  Patient presents with  . Right Knee - Pain    35 year old female who is seen today for evaluation of her right knee. She states that about one year ago he started having pain in the knees on the right with going up and down stairs. She states she did some stretching exercises and conservative treatment and her pain improved. However her pain restarted 2 weeks ago and she states the pain is in the superior pole of the patella at the quadriceps attachment. She feels like it wants to pop. She denies any  catching, locking, popping, or giving way at this time. It is certainly worse at night after laying down and trying to stretch. She has difficulty with sleeping. She has tried Mobic 15 mg much help.  Right knee pain x 1 year, worsening x 2 weeks, no injury, difficulty walking, difficulty climbing steps, feels like knee is going to pop when patient is laying down and stretching, difficulty sleeping, Mobic does not help    Review of Systems  Constitutional: Negative.   HENT: Negative.   Respiratory: Negative.   Cardiovascular: Negative.   Gastrointestinal: Negative.   Genitourinary: Negative.   Skin: Negative.   Neurological: Negative.   Hematological: Negative.   Psychiatric/Behavioral: Negative.      Objective: Vital Signs: BP (!) 149/87 (BP Location: Right Arm, Patient Position: Sitting, Cuff Size: Normal)   Pulse 86   Resp 12   Ht 5\' 5"  (1.651 m)   Wt 196 lb (88.9 kg)   LMP 12/14/2015   BMI 32.62 kg/m   Physical Exam  Constitutional: She is oriented to person, place, and time. She appears well-developed and well-nourished.  HENT:  Head: Normocephalic and atraumatic.  Eyes: EOM are normal. Pupils are equal, round, and reactive  to light.  Neck:  No carotid bruits  Pulmonary/Chest: Effort normal.  Neurological: She is alert and oriented to person, place, and time.  Skin: Skin is warm and dry.  Psychiatric: She has a normal mood and affect. Her behavior is normal. Judgment and thought content normal.    Right Knee Exam   Range of Motion  Extension: 5  Flexion: 110   Tests  Drawer:       Anterior - negative    Posterior - negative  Other  Sensation: normal Pulse: present Swelling: none  Comments:  She may have some fine crepitance in the patellofemoral joint. She is tender over the insertion of the quadriceps tendon on the patella superiorly. Very hypermobile patella. She certainly has at least 5 of recurvatum bilaterally. She also has recurvatum at the elbows.  Almost can get her thumb to her radial portion of her wrist.      Specialty Comments:  No specialty comments available.  Imaging: Xr Knee 3 View Right  Result Date: 03/19/2016 Three-view knee on the right reveals good maintenance of joint spaces. I have lateral positioning of the patella on AP and sunrise views.    PMFS History: Patient Active Problem List   Diagnosis Date Noted  . Myalgia and myositis 06/15/2014  . Acute pharyngitis 06/15/2014  . Viral syndrome 06/15/2014  . Patellofemoral arthralgia of left knee 05/27/2013  . Numerous moles 05/27/2013  . LLQ pain 02/09/2013  . Pink eye 04/29/2012  . Chest pain 12/04/2011  . Insomnia 12/04/2011  . Physical exam, annual 10/22/2011   Past Medical History:  Diagnosis Date  . Allergy   . Anemia   . Chicken pox   . Chlamydia    remote h/o  . UTI (urinary tract infection)     History reviewed. No pertinent family history.  History reviewed. No pertinent surgical history. Social History   Occupational History  . Not on file.   Social History Main Topics  . Smoking status: Never Smoker  . Smokeless tobacco: Never Used  . Alcohol use Yes     Comment: ocassionally  . Drug use: No  . Sexual activity: Yes    Birth control/ protection: Pill     Comment: seasonique

## 2016-05-21 LAB — HM PAP SMEAR

## 2016-06-16 ENCOUNTER — Other Ambulatory Visit: Payer: Self-pay | Admitting: Family Medicine

## 2016-06-17 NOTE — Telephone Encounter (Signed)
Last ov 11/09/15 CPE Zolpidem last filled 12/12/15 #30 with 3

## 2016-06-17 NOTE — Telephone Encounter (Signed)
Medication filled to pharmacy as requested.   

## 2016-09-22 ENCOUNTER — Telehealth: Payer: Self-pay | Admitting: Family Medicine

## 2016-09-22 NOTE — Telephone Encounter (Signed)
Pt states that she is leaving for a cruise on Saturday and asking if a Rx for patches could be called in for motion sickness, [facility] on [address]

## 2016-09-23 MED ORDER — SCOPOLAMINE 1 MG/3DAYS TD PT72: 1.0000 | MEDICATED_PATCH | TRANSDERMAL | 0 refills | Status: DC

## 2016-09-23 NOTE — Telephone Encounter (Signed)
Medication filled to pharmacy as requested.  Called pt and advised.

## 2016-09-23 NOTE — Telephone Encounter (Signed)
Ok for Scopolamine patches- 1 patch every 72 hrs as needed for motion sickness.  Disp #1 box (4 patches)

## 2016-09-23 NOTE — Addendum Note (Signed)
Addended by: Davis Gourd on: 09/23/2016 12:03 PM   Modules accepted: Orders

## 2016-09-23 NOTE — Telephone Encounter (Signed)
We can try and send this to another pharmacy, but if there's a nationwide backorder, I would recommend non-drowsy dramamine (available OTC) prior to getting on boat and taking until trip is over

## 2016-09-23 NOTE — Telephone Encounter (Signed)
Please advise, I received a fax from the pharmacy that this medication is on back order and not available.

## 2016-09-23 NOTE — Telephone Encounter (Signed)
Please advise 

## 2016-09-23 NOTE — Telephone Encounter (Signed)
Pt made aware of PCP recommendations, tried sending in Rx to new pharmacy [facility] in [place]. I advised pt of what to do if they do not have rx.

## 2016-11-18 ENCOUNTER — Encounter: Payer: Self-pay | Admitting: Family Medicine

## 2016-11-18 ENCOUNTER — Ambulatory Visit (INDEPENDENT_AMBULATORY_CARE_PROVIDER_SITE_OTHER): Payer: BLUE CROSS/BLUE SHIELD | Admitting: Family Medicine

## 2016-11-18 VITALS — BP 120/78 | HR 81 | Temp 98.6°F | Resp 17 | Ht 65.0 in | Wt 182.1 lb

## 2016-11-18 DIAGNOSIS — E669 Obesity, unspecified: Secondary | ICD-10-CM | POA: Insufficient documentation

## 2016-11-18 DIAGNOSIS — Z0000 Encounter for general adult medical examination without abnormal findings: Secondary | ICD-10-CM | POA: Diagnosis not present

## 2016-11-18 DIAGNOSIS — F419 Anxiety disorder, unspecified: Secondary | ICD-10-CM | POA: Diagnosis not present

## 2016-11-18 DIAGNOSIS — E559 Vitamin D deficiency, unspecified: Secondary | ICD-10-CM | POA: Diagnosis not present

## 2016-11-18 LAB — LIPID PANEL
CHOL/HDL RATIO: 3
CHOLESTEROL: 171 mg/dL (ref 0–200)
HDL: 52.4 mg/dL (ref >39.00)
LDL CALC: 106 mg/dL — AB (ref 0–99)
NONHDL: 118.37
Triglycerides: 64 mg/dL (ref 0.0–149.0)
VLDL: 12.8 mg/dL (ref 0.0–40.0)

## 2016-11-18 LAB — BASIC METABOLIC PANEL
BUN: 8 mg/dL (ref 6–23)
CALCIUM: 9.3 mg/dL (ref 8.4–10.5)
CO2: 29 mEq/L (ref 19–32)
Chloride: 102 mEq/L (ref 96–112)
Creatinine, Ser: 0.69 mg/dL (ref 0.40–1.20)
GFR: 124.76 mL/min (ref >60.00)
Glucose, Bld: 95 mg/dL (ref 70–99)
POTASSIUM: 3.8 meq/L (ref 3.5–5.1)
SODIUM: 137 meq/L (ref 135–145)

## 2016-11-18 LAB — CBC WITH DIFFERENTIAL/PLATELET
BASOS ABS: 0 10*3/uL (ref 0.0–0.1)
Basophils Relative: 0.4 % (ref 0.0–3.0)
EOS ABS: 0.2 10*3/uL (ref 0.0–0.7)
EOS PCT: 3 % (ref 0.0–5.0)
HCT: 34.7 % — ABNORMAL LOW (ref 36.0–46.0)
Hemoglobin: 11 g/dL — ABNORMAL LOW (ref 12.0–15.0)
LYMPHS ABS: 2.8 10*3/uL (ref 0.7–4.0)
Lymphocytes Relative: 37.7 % (ref 12.0–46.0)
MCHC: 31.7 g/dL (ref 30.0–36.0)
MCV: 84.5 fl (ref 78.0–100.0)
MONO ABS: 0.3 10*3/uL (ref 0.1–1.0)
Monocytes Relative: 3.9 % (ref 3.0–12.0)
NEUTROS PCT: 55 % (ref 43.0–77.0)
Neutro Abs: 4 10*3/uL (ref 1.4–7.7)
Platelets: 377 10*3/uL (ref 150.0–400.0)
RBC: 4.11 Mil/uL (ref 3.87–5.11)
RDW: 15.3 % (ref 11.5–15.5)
WBC: 7.3 10*3/uL (ref 4.0–10.5)

## 2016-11-18 LAB — HEPATIC FUNCTION PANEL
ALBUMIN: 3.6 g/dL (ref 3.5–5.2)
ALK PHOS: 63 U/L (ref 39–117)
ALT: 8 U/L (ref 0–35)
AST: 10 U/L (ref 0–37)
Bilirubin, Direct: 0.1 mg/dL (ref 0.0–0.3)
TOTAL PROTEIN: 7.6 g/dL (ref 6.0–8.3)
Total Bilirubin: 0.5 mg/dL (ref 0.2–1.2)

## 2016-11-18 LAB — VITAMIN D 25 HYDROXY (VIT D DEFICIENCY, FRACTURES): VITD: 19.43 ng/mL — ABNORMAL LOW (ref 30.00–100.00)

## 2016-11-18 LAB — TSH: TSH: 1.18 u[IU]/mL (ref 0.35–4.50)

## 2016-11-18 MED ORDER — SERTRALINE HCL 25 MG PO TABS: 25.0000 mg | ORAL_TABLET | Freq: Every day | ORAL | 3 refills | Status: DC

## 2016-11-18 NOTE — Assessment & Plan Note (Signed)
Check labs.  Replete prn. 

## 2016-11-18 NOTE — Progress Notes (Signed)
   Subjective:    Patient ID: Tracy Burch, female    DOB: [birth year], 36 y.o.   MRN: [id number]  HPI CPE- UTD on GYN, Tdap.  Pt is down 14 lbs.   Review of Systems Patient reports no vision/ hearing changes, adenopathy,fever, weight change,  persistant/recurrent hoarseness , swallowing issues, chest pain, edema, persistant/recurrent cough, hemoptysis, dyspnea (rest/exertional/paroxysmal nocturnal), gastrointestinal bleeding (melena, rectal bleeding), abdominal pain, significant heartburn, bowel changes, GU symptoms (dysuria, hematuria, incontinence), Gyn symptoms (abnormal  bleeding, pain),  syncope, focal weakness, memory loss, numbness & tingling, skin/hair/nail changes, abnormal bruising or bleeding  + anxiety- pt reports she is having palpitations 1-2x/months, having difficulty sleeping.  'i've never felt like this before'.  Pt cannot identify any specific triggers    Objective:   Physical Exam General Appearance:    Alert, cooperative, no distress, appears stated age  Head:    Normocephalic, without obvious abnormality, atraumatic  Eyes:    PERRL, conjunctiva/corneas clear, EOM's intact, fundi    benign, both eyes  Ears:    Normal TM's and external ear canals, both ears  Nose:   Nares normal, septum midline, mucosa normal, no drainage    or sinus tenderness  Throat:   Lips, mucosa, and tongue normal; teeth and gums normal  Neck:   Supple, symmetrical, trachea midline, no adenopathy;    Thyroid: no enlargement/tenderness/nodules  Back:     Symmetric, no curvature, ROM normal, no CVA tenderness  Lungs:     Clear to auscultation bilaterally, respirations unlabored  Chest Wall:    No tenderness or deformity   Heart:    Regular rate and rhythm, S1 and S2 normal, no murmur, rub   or gallop  Breast Exam:    Deferred to GYN  Abdomen:     Soft, non-tender, bowel sounds active all four quadrants,    no masses, no organomegaly  Genitalia:    Deferred to GYN  Rectal:    Extremities:    Extremities normal, atraumatic, no cyanosis or edema  Pulses:   2+ and symmetric all extremities  Skin:   Skin color, texture, turgor normal, no rashes or lesions  Lymph nodes:   Cervical, supraclavicular, and axillary nodes normal  Neurologic:   CNII-XII intact, normal strength, sensation and reflexes    throughout          Assessment & Plan:

## 2016-11-18 NOTE — Progress Notes (Signed)
Pre visit review using our clinic review tool, if applicable. No additional management support is needed unless otherwise documented below in the visit note. 

## 2016-11-18 NOTE — Assessment & Plan Note (Signed)
Pt's PE WNL.  UTD on GYN.  Refuses flu shot today.  Check labs.  Anticipatory guidance provided.

## 2016-11-18 NOTE — Assessment & Plan Note (Signed)
Pt has recently lost 14 lbs.  Applauded her efforts.  Check labs to risk stratify.

## 2016-11-18 NOTE — Patient Instructions (Signed)
Follow up in 3-4 weeks to recheck anxiety levels We'll notify you of your lab results and make any changes if needed Continue to work on healthy diet and regular exercise- you look great!! Start the Sertraline- 1 tab daily- for the anxiety Call with any questions or concerns Hang in there!  We'll figure this out!

## 2016-11-18 NOTE — Assessment & Plan Note (Signed)
New.  Pt's sxs are consistent w/ anxiety.  She does not like the way she feels consistently wound up.  Start low dose Sertraline.  Will follow closely.

## 2016-11-19 ENCOUNTER — Other Ambulatory Visit: Payer: Self-pay | Admitting: General Practice

## 2016-11-19 MED ORDER — VITAMIN D (ERGOCALCIFEROL) 1.25 MG (50000 UNIT) PO CAPS: 50000.0000 [IU] | ORAL_CAPSULE | ORAL | 0 refills | Status: DC

## 2016-12-08 ENCOUNTER — Other Ambulatory Visit: Payer: Self-pay | Admitting: Family Medicine

## 2016-12-08 ENCOUNTER — Ambulatory Visit (INDEPENDENT_AMBULATORY_CARE_PROVIDER_SITE_OTHER): Payer: BLUE CROSS/BLUE SHIELD | Admitting: Family Medicine

## 2016-12-08 ENCOUNTER — Encounter: Payer: Self-pay | Admitting: Family Medicine

## 2016-12-08 VITALS — BP 130/81 | HR 80 | Temp 98.0°F | Resp 17 | Ht 65.0 in | Wt 182.0 lb

## 2016-12-08 DIAGNOSIS — F419 Anxiety disorder, unspecified: Secondary | ICD-10-CM | POA: Diagnosis not present

## 2016-12-08 NOTE — Progress Notes (Signed)
   Subjective:    Patient ID: Tracy Burch, female    DOB: February 16, 1981, 36 y.o.   MRN: [id number]  HPI Anxiety- new at last visit.  Pt was started on Sertraline 25mg .  Pt reports feeling much better since starting the medication.  No longer having palpitations or trouble sleeping.  No longer having racing thoughts.   Review of Systems For ROS see HPI     Objective:   Physical Exam  Constitutional: She is oriented to person, place, and time. She appears well-developed and well-nourished. No distress.  HENT:  Head: Normocephalic and atraumatic.  Neurological: She is alert and oriented to person, place, and time.  Skin: Skin is warm and dry.  Psychiatric: She has a normal mood and affect. Her behavior is normal. Thought content normal.  Vitals reviewed.         Assessment & Plan:

## 2016-12-08 NOTE — Progress Notes (Signed)
Pre visit review using our clinic review tool, if applicable. No additional management support is needed unless otherwise documented below in the visit note. 

## 2016-12-08 NOTE — Patient Instructions (Signed)
Follow up in 1 year or as needed Keep up the good work!  You look great! No medication changes at this time Call with any questions or concerns Happy Fall!!!

## 2016-12-08 NOTE — Assessment & Plan Note (Signed)
Much improved since starting Sertraline.  No med changes at this time.  Will continue to follow.

## 2017-02-20 ENCOUNTER — Other Ambulatory Visit: Payer: Self-pay | Admitting: Family Medicine

## 2017-02-23 NOTE — Telephone Encounter (Signed)
Last OV 12/08/16 Zolpidem last filled 06/17/16 #30 with 3

## 2017-02-23 NOTE — Telephone Encounter (Signed)
Medication filled to pharmacy as requested.   [place] for [facility] to Discuss results / PCP recommendations / Schedule patient.

## 2017-04-01 ENCOUNTER — Other Ambulatory Visit: Payer: Self-pay | Admitting: Family Medicine

## 2017-04-02 ENCOUNTER — Telehealth: Payer: Self-pay | Admitting: Family Medicine

## 2017-04-02 ENCOUNTER — Ambulatory Visit: Payer: Self-pay

## 2017-04-02 NOTE — Telephone Encounter (Signed)
Called pt to find out when she began taking the zoloft? (the chart says Rx was written in August), If she has been on the medicine since August, Per PCP it is not the zoloft. Pt will need an appt.    [place] for [facility] to Discuss results / PCP recommendations / Schedule patient.

## 2017-04-02 NOTE — Telephone Encounter (Signed)
Called patient and left VM [phone] to call and schedule an appointment with PCP for her c/o "night sweats" per Dr. Birdie Riddle recommendation.

## 2017-04-02 NOTE — Telephone Encounter (Signed)
Patient called in with c/o "side effect from medication Zoloft." When asked what kind of side effect, she states "night sweats. I've been waking up every night since around 03/14/17 with my bed soaking wet. I noticed since I've been on the medication, I was having hot flashes, but the night sweats are concerning and I just want to know what can I do.  The medication is working and I am still taking it, but need to know what to do about these night sweats." I advised the patient that I would notify her PCP according to the protocol and she will be getting an answer about the night sweats and Zoloft. She verbalized understanding.  Reason for Disposition . Caller has NON-URGENT medication question about med that PCP prescribed and triager unable to answer question  Answer Assessment - Initial Assessment Questions 1. SYMPTOMS: "Do you have any symptoms?"     Night sweats 2. SEVERITY: If symptoms are present, ask "Are they mild, moderate or severe?"     Severe-waking up with a soaked bed since Dec. 15th  Protocols used: MEDICATION QUESTION CALL-A-AH

## 2017-04-02 NOTE — Telephone Encounter (Signed)
Returned call from [place] at the office pertaining to Zoloft and her having night sweats.   Questions if the night sweats are from the Zoloft. I spoke with the flow coordinator who advised to make an appt so she and Dr. Birdie Riddle can discuss it. Appt made for 04/07/17 at 9:00 with Dr. Birdie Riddle.

## 2017-04-03 NOTE — Telephone Encounter (Signed)
Called pt again and LMOVM to return call.

## 2017-04-06 NOTE — Telephone Encounter (Signed)
Encounter closed, pt will be in tomorrow for appt.

## 2017-04-07 ENCOUNTER — Ambulatory Visit: Payer: BLUE CROSS/BLUE SHIELD | Admitting: Family Medicine

## 2017-04-25 ENCOUNTER — Encounter (HOSPITAL_COMMUNITY): Payer: Self-pay

## 2017-04-25 ENCOUNTER — Emergency Department (HOSPITAL_COMMUNITY)
Admission: EM | Admit: 2017-04-25 | Discharge: 2017-04-26 | Disposition: A | Discharge status: Home / Self Care | Payer: Managed Care, Other (non HMO) | Attending: Physician Assistant | Admitting: Physician Assistant

## 2017-04-25 DIAGNOSIS — Z711 Person with feared health complaint in whom no diagnosis is made: Secondary | ICD-10-CM | POA: Insufficient documentation

## 2017-04-25 DIAGNOSIS — Z79899 Other long term (current) drug therapy: Secondary | ICD-10-CM | POA: Insufficient documentation

## 2017-04-25 DIAGNOSIS — W25XXXA Contact with sharp glass, initial encounter: Secondary | ICD-10-CM | POA: Insufficient documentation

## 2017-04-25 NOTE — ED Triage Notes (Signed)
Pt states that she was at a restaurant and found glass in her drink, states she is unsure if she swallowed any and just wants to be checked out

## 2017-04-26 NOTE — Discharge Instructions (Signed)
No lesions inside your mouth, tongue or throat.  Return to the emergency department if you develop stomach pain, fever greater than 100.4 F or vomiting that will not stop.

## 2017-04-26 NOTE — ED Provider Notes (Signed)
[name] Provider Note   CSN: [id number] Arrival date & time: 04/25/17  2314     History   Chief Complaint Chief Complaint  Patient presents with  . Foreign Body    HPI Tracy Burch is a 37 y.o. female.  HPI   Ms. Tracy Burch is a 37 year old female with a history of allergies and anxiety who presents to the emergency department for evaluation after finding glass in her drink while at a fast food restaurant.  Patient states that she is unsure if she swallowed any glass and wants to make sure she does not have any cuts in her throat.  States that she found a large piece of glass (approximately 2 cm in diameter) in her glass about 2 hours ago.  She did not notice this until she was finished drinking.  Denies feeling any pain while drinking her beverage.  Denies throat pain, dysphagia, abdominal pain, chest pain, nausea/vomiting, fever, chills.    Past Medical History:  Diagnosis Date  . Allergy   . Anemia   . Chicken pox   . Chlamydia    remote h/o  . UTI (urinary tract infection)     Patient Active Problem List   Diagnosis Date Noted  . Vitamin D deficiency 11/18/2016  . Obesity (BMI 30.0-34.9) 11/18/2016  . Anxiety 11/18/2016  . Myalgia and myositis 06/15/2014  . Acute pharyngitis 06/15/2014  . Viral syndrome 06/15/2014  . Patellofemoral arthralgia of left knee 05/27/2013  . Numerous moles 05/27/2013  . LLQ pain 02/09/2013  . Pink eye 04/29/2012  . Chest pain 12/04/2011  . Insomnia 12/04/2011  . Physical exam, annual 10/22/2011    History reviewed. No pertinent surgical history.  OB History    Gravida Para Term Preterm AB Living   1         1   SAB TAB Ectopic Multiple Live Births                   Home Medications    Prior to Admission medications   Medication Sig Start Date End Date Taking? Authorizing Provider  levonorgestrel-ethinyl estradiol (SEASONALE,INTROVALE,JOLESSA) 0.15-0.03 MG tablet Take 1 tablet by  mouth daily. 05/03/12   Earnstine Regal, PA-C  montelukast (SINGULAIR) 10 MG tablet TAKE ONE TABLET BY MOUTH AT BEDTIME 12/09/16   Midge Minium, MD  sertraline (ZOLOFT) 25 MG tablet TAKE 1 TABLET BY MOUTH ONCE DAILY 04/02/17   Midge Minium, MD  Vitamin D, Ergocalciferol, (DRISDOL) 50000 units CAPS capsule Take 1 capsule (50,000 Units total) by mouth every 7 (seven) days. 11/19/16   Midge Minium, MD  zolpidem (AMBIEN) 5 MG tablet TAKE 1 TABLET BY MOUTH AT BEDTIME AS NEEDED FOR SLEEP 02/23/17   Midge Minium, MD    Family History No family history on file.  Social History Social History   Tobacco Use  . Smoking status: Never Smoker  . Smokeless tobacco: Never Used  Substance Use Topics  . Alcohol use: Yes    Comment: ocassionally  . Drug use: No     Allergies   Patient has no known allergies.   Review of Systems Review of Systems  Constitutional: Negative for chills, fatigue and fever.  HENT: Negative for sore throat and trouble swallowing.   Respiratory: Negative for shortness of breath.   Cardiovascular: Negative for chest pain.  Gastrointestinal: Negative for abdominal pain, blood in stool, nausea and vomiting.  Skin: Negative for wound.     Physical  Exam Updated Vital Signs BP 132/66 (BP Location: Right Arm)   Pulse 72   Temp 98.6 F (37 C) (Oral)   Resp 16   SpO2 100%   Physical Exam  Constitutional: She is oriented to person, place, and time. She appears well-developed and well-nourished. No distress.  Sitting at the bedside in no apparent distress.  HENT:  Head: Normocephalic and atraumatic.  No cuts or lacerations noted over the tongue or buccal mucosa.  Posterior oropharynx moist and clear.  No oral pharyngeal erythema.  Uvula midline.  Airway patent.  Able to handle oral secretions.  Eyes: Conjunctivae are normal. Pupils are equal, round, and reactive to light. Right eye exhibits no discharge. Left eye exhibits no discharge.  Neck:  Normal range of motion. Neck supple. No tracheal deviation present.  No tenderness over the neck.  Cardiovascular: Normal rate, regular rhythm and intact distal pulses. Exam reveals no friction rub.  No murmur heard. Pulmonary/Chest: Effort normal and breath sounds normal. No stridor. No respiratory distress. She has no wheezes. She has no rales.  No tenderness to chest palpation.  Abdominal: Soft. Bowel sounds are normal.  Abdomen nontender.  No guarding or rigidity.  Lymphadenopathy:    She has no cervical adenopathy.  Neurological: She is alert and oriented to person, place, and time. Coordination normal.  Skin: Skin is warm and dry. She is not diaphoretic.  Psychiatric: She has a normal mood and affect. Her behavior is normal.  Nursing note and vitals reviewed.    ED Treatments / Results  Labs (all labs ordered are listed, but only abnormal results are displayed) Labs Reviewed - No data to display  EKG  EKG Interpretation None       Radiology No results found.  Procedures Procedures (including critical care time)  Medications Ordered in ED Medications - No data to display   Initial Impression / Assessment and Plan / ED Course  I have reviewed the triage vital signs and the nursing notes.  Pertinent labs & imaging results that were available during my care of the patient were reviewed by me and considered in my medical decision making (see chart for details).     Presents after noticing glass in her drink while at [employer].  Denies any symptoms.  She is nontoxic-appearing and in no acute distress.  Vital signs stable.  On exam, no lesions inside the mouth.  Airway patent.  No abdominal tenderness.  Do not suspect intra-abdominal injury.  Have discussed return precautions and patient agrees.  She has no complaints prior to discharge.  Final Clinical Impressions(s) / ED Diagnoses   Final diagnoses:  Contact with sharp glass, initial encounter    ED Discharge  Orders    None       Glyn Ade, PA-C 04/26/17 0015    Macarthur Critchley, MD 04/26/17 1529

## 2017-06-19 ENCOUNTER — Telehealth: Payer: Self-pay | Admitting: Family Medicine

## 2017-06-19 MED ORDER — FLUTICASONE PROPIONATE 50 MCG/ACT NA SUSP: 2.0000 | Freq: Every day | NASAL | 6 refills | Status: DC

## 2017-06-19 NOTE — Telephone Encounter (Signed)
Ok to send Flonase- 2 sprays each nostril daily, 1 bottle w/ 6 refills

## 2017-06-19 NOTE — Telephone Encounter (Signed)
Medication has been sent to pharmacy.  °

## 2017-06-19 NOTE — Telephone Encounter (Signed)
Medication is not on patient's list.  Please advise

## 2017-06-19 NOTE — Telephone Encounter (Signed)
Pt states she is having seasonal allergies, congested.  Still taking singular. Pt states she will make an appt if she needs .  Please advise.

## 2017-06-19 NOTE — Telephone Encounter (Signed)
Patient is requesting a prescription for Flonase for her allergies.  Attempted to call patient- number not working.

## 2017-06-19 NOTE — Telephone Encounter (Signed)
Copied from [place] [id number]. Topic: Quick Communication - Rx Refill/Question >> Jun 19, 2017  9:10 AM Boyd Kerbs wrote:  Medication: sluticasone propionate nasal spray  She is needing for her allergies  Has the patient contacted their pharmacy? No. (Agent: If no, request that the patient contact the pharmacy for the refill.)  Preferred Pharmacy (with phone number or street name): [facility] ([address]), [place] - [address] [place] ([place]) [place] [address] Phone: [phone] Fax: [phone]   Agent: Please be advised that RX refills may take up to 3 business days. We ask that you follow-up with your pharmacy.

## 2017-08-19 ENCOUNTER — Other Ambulatory Visit: Payer: Self-pay | Admitting: Family Medicine

## 2017-09-20 ENCOUNTER — Other Ambulatory Visit: Payer: Self-pay | Admitting: Family Medicine

## 2017-10-12 ENCOUNTER — Other Ambulatory Visit: Payer: Self-pay | Admitting: Family Medicine

## 2017-10-12 NOTE — Telephone Encounter (Signed)
Last OV 12/08/16 Zolpidem last filled 02/23/17 #30 with 3

## 2017-11-19 ENCOUNTER — Encounter: Payer: Self-pay | Admitting: Emergency Medicine

## 2017-11-19 ENCOUNTER — Other Ambulatory Visit: Payer: Self-pay

## 2017-11-19 ENCOUNTER — Encounter: Payer: Self-pay | Admitting: Family Medicine

## 2017-11-19 ENCOUNTER — Ambulatory Visit (INDEPENDENT_AMBULATORY_CARE_PROVIDER_SITE_OTHER): Payer: Managed Care, Other (non HMO) | Admitting: Family Medicine

## 2017-11-19 VITALS — BP 132/82 | HR 81 | Temp 98.5°F | Resp 16 | Ht 66.0 in | Wt 188.6 lb

## 2017-11-19 DIAGNOSIS — E669 Obesity, unspecified: Secondary | ICD-10-CM

## 2017-11-19 DIAGNOSIS — E559 Vitamin D deficiency, unspecified: Secondary | ICD-10-CM

## 2017-11-19 DIAGNOSIS — Z0000 Encounter for general adult medical examination without abnormal findings: Secondary | ICD-10-CM | POA: Diagnosis not present

## 2017-11-19 LAB — CBC WITH DIFFERENTIAL/PLATELET
Basophils Absolute: 0 10*3/uL (ref 0.0–0.1)
Basophils Relative: 0.4 % (ref 0.0–3.0)
Eosinophils Absolute: 0.3 10*3/uL (ref 0.0–0.7)
Eosinophils Relative: 3.7 % (ref 0.0–5.0)
HCT: 33.6 % — ABNORMAL LOW (ref 36.0–46.0)
Hemoglobin: 10.8 g/dL — ABNORMAL LOW (ref 12.0–15.0)
Lymphocytes Relative: 39.8 % (ref 12.0–46.0)
Lymphs Abs: 2.7 10*3/uL (ref 0.7–4.0)
MCHC: 32.2 g/dL (ref 30.0–36.0)
MCV: 82.7 fl (ref 78.0–100.0)
Monocytes Absolute: 0.3 10*3/uL (ref 0.1–1.0)
Monocytes Relative: 4.5 % (ref 3.0–12.0)
Neutro Abs: 3.5 10*3/uL (ref 1.4–7.7)
Neutrophils Relative %: 51.6 % (ref 43.0–77.0)
Platelets: 362 10*3/uL (ref 150.0–400.0)
RBC: 4.06 Mil/uL (ref 3.87–5.11)
RDW: 15.9 % — ABNORMAL HIGH (ref 11.5–15.5)
WBC: 6.8 10*3/uL (ref 4.0–10.5)

## 2017-11-19 LAB — LIPID PANEL
CHOLESTEROL: 179 mg/dL (ref 0–200)
HDL: 56.3 mg/dL (ref >39.00)
LDL CALC: 111 mg/dL — AB (ref 0–99)
NonHDL: 122.2
TRIGLYCERIDES: 54 mg/dL (ref 0.0–149.0)
Total CHOL/HDL Ratio: 3
VLDL: 10.8 mg/dL (ref 0.0–40.0)

## 2017-11-19 LAB — BASIC METABOLIC PANEL
BUN: 8 mg/dL (ref 6–23)
CHLORIDE: 103 meq/L (ref 96–112)
CO2: 29 meq/L (ref 19–32)
Calcium: 9.5 mg/dL (ref 8.4–10.5)
Creatinine, Ser: 0.72 mg/dL (ref 0.40–1.20)
GFR: 118.1 mL/min (ref >60.00)
GLUCOSE: 100 mg/dL — AB (ref 70–99)
POTASSIUM: 3.8 meq/L (ref 3.5–5.1)
SODIUM: 138 meq/L (ref 135–145)

## 2017-11-19 LAB — VITAMIN D 25 HYDROXY (VIT D DEFICIENCY, FRACTURES): VITD: 18.42 ng/mL — ABNORMAL LOW (ref 30.00–100.00)

## 2017-11-19 LAB — HEPATIC FUNCTION PANEL
ALT: 7 U/L (ref 0–35)
AST: 8 U/L (ref 0–37)
Albumin: 3.8 g/dL (ref 3.5–5.2)
Alkaline Phosphatase: 73 U/L (ref 39–117)
Bilirubin, Direct: 0.1 mg/dL (ref 0.0–0.3)
Total Bilirubin: 0.5 mg/dL (ref 0.2–1.2)
Total Protein: 7.3 g/dL (ref 6.0–8.3)

## 2017-11-19 LAB — TSH: TSH: 1.48 u[IU]/mL (ref 0.35–4.50)

## 2017-11-19 MED ORDER — VITAMIN D (ERGOCALCIFEROL) 1.25 MG (50000 UNIT) PO CAPS: 50000.0000 [IU] | ORAL_CAPSULE | ORAL | 0 refills | Status: DC

## 2017-11-19 NOTE — Assessment & Plan Note (Signed)
Pt's PE WNL w/ exception of obesity.  UTD on GYN.  Check labs.  Anticipatory guidance provided.  

## 2017-11-19 NOTE — Progress Notes (Signed)
   Subjective:    Patient ID: Tracy Burch, female    DOB: 05-14-1980, 37 y.o.   MRN: [id number]  HPI CPE- UTD on pap, Tdap.   Review of Systems Patient reports no vision/ hearing changes, adenopathy,fever, weight change,  persistant/recurrent hoarseness , swallowing issues, chest pain, palpitations, edema, persistant/recurrent cough, hemoptysis, dyspnea (rest/exertional/paroxysmal nocturnal), gastrointestinal bleeding (melena, rectal bleeding), abdominal pain, significant heartburn, bowel changes, GU symptoms (dysuria, hematuria, incontinence), Gyn symptoms (abnormal  bleeding, pain),  syncope, focal weakness, memory loss, numbness & tingling, skin/hair/nail changes, abnormal bruising or bleeding, anxiety, or depression.     Objective:   Physical Exam General Appearance:    Alert, cooperative, no distress, appears stated age  Head:    Normocephalic, without obvious abnormality, atraumatic  Eyes:    PERRL, conjunctiva/corneas clear, EOM's intact, fundi    benign, both eyes  Ears:    Normal TM's and external ear canals, both ears  Nose:   Nares normal, septum midline, mucosa normal, no drainage    or sinus tenderness  Throat:   Lips, mucosa, and tongue normal; teeth and gums normal  Neck:   Supple, symmetrical, trachea midline, no adenopathy;    Thyroid: no enlargement/tenderness/nodules  Back:     Symmetric, no curvature, ROM normal, no CVA tenderness  Lungs:     Clear to auscultation bilaterally, respirations unlabored  Chest Wall:    No tenderness or deformity   Heart:    Regular rate and rhythm, S1 and S2 normal, no murmur, rub   or gallop  Breast Exam:    Deferred to GYN  Abdomen:     Soft, non-tender, bowel sounds active all four quadrants,    no masses, no organomegaly  Genitalia:    Deferred to GYN  Rectal:    Extremities:   Extremities normal, atraumatic, no cyanosis or edema  Pulses:   2+ and symmetric all extremities  Skin:   Skin color, texture, turgor normal, no  rashes or lesions  Lymph nodes:   Cervical, supraclavicular, and axillary nodes normal  Neurologic:   CNII-XII intact, normal strength, sensation and reflexes    throughout          Assessment & Plan:

## 2017-11-19 NOTE — Patient Instructions (Signed)
Follow up in 1 year or as needed We'll notify you of your lab results and make any changes if needed Continue to work on healthy diet and regular exercise- you can do it! Call with any questions or concerns Happy Labor Day!!!

## 2017-11-19 NOTE — Assessment & Plan Note (Signed)
Ongoing issue for pt.  Stressed need for healthy diet and regular exercise.  Check labs to risk stratify.  Will follow 

## 2017-11-19 NOTE — Assessment & Plan Note (Signed)
Pt has hx of this.  Check labs and replete prn. 

## 2017-12-22 ENCOUNTER — Other Ambulatory Visit: Payer: Self-pay | Admitting: Family Medicine

## 2018-02-09 ENCOUNTER — Encounter: Payer: Self-pay | Admitting: Family Medicine

## 2018-03-23 ENCOUNTER — Other Ambulatory Visit: Payer: Self-pay | Admitting: Family Medicine

## 2018-03-23 NOTE — Telephone Encounter (Signed)
Last OV 11/19/17 Zolpidem last filled 10/12/17 #30 with 3

## 2018-04-26 ENCOUNTER — Other Ambulatory Visit: Payer: Self-pay | Admitting: Family Medicine

## 2018-04-27 ENCOUNTER — Other Ambulatory Visit: Payer: Self-pay | Admitting: Family Medicine

## 2018-05-05 ENCOUNTER — Other Ambulatory Visit: Payer: Self-pay | Admitting: Physician Assistant

## 2018-05-06 NOTE — Telephone Encounter (Signed)
Last refill: 03/23/18 #30, 0 Last OV: 11/19/17

## 2018-05-20 LAB — HM PAP SMEAR

## 2018-05-25 ENCOUNTER — Encounter: Payer: Self-pay | Admitting: Family Medicine

## 2018-05-26 ENCOUNTER — Encounter: Payer: Self-pay | Admitting: General Practice

## 2018-06-21 ENCOUNTER — Other Ambulatory Visit: Payer: Self-pay | Admitting: Family Medicine

## 2018-06-22 NOTE — Telephone Encounter (Signed)
Last OV 11/19/17 Zolpidem last filled 05/06/18 #30 with 0

## 2018-07-20 ENCOUNTER — Other Ambulatory Visit: Payer: Self-pay | Admitting: Family Medicine

## 2018-07-20 NOTE — Telephone Encounter (Signed)
Last refill:06/23/18 #30, 0 Last OV: 11/19/17

## 2018-07-27 ENCOUNTER — Telehealth: Payer: Managed Care, Other (non HMO) | Admitting: Physician Assistant

## 2018-07-27 DIAGNOSIS — R21 Rash and other nonspecific skin eruption: Secondary | ICD-10-CM | POA: Diagnosis not present

## 2018-07-27 DIAGNOSIS — Z91038 Other insect allergy status: Secondary | ICD-10-CM

## 2018-07-27 NOTE — Progress Notes (Signed)
E Visit for Insect Sting  Thank you for describing the insect sting for [name].  Here is how we plan to help!  An uncomplicated insect sting that just occurred and can be closely followed using the instructions in your care plan.  The 2 greatest risks from insect stings are allergic reaction, which can be fatal in some people and infection, which is more common and less serious.  Bees, wasps, yellow jackets, and hornets belong to a class of insects called Hymenoptera.  Most insect stings cause only minor discomfort.  Stings can happen anywhere on the body and can be painful.  Most stings are from honey bees or yellow jackets.  Fire ants can sting multiple times.  The sites of the stings are more likely to become infected.    Based on your information I have: and Provided a home care guide for insect stings and instructions on when to call for help.  What can be used to prevent Insect Stings?  Insect repellant with at least 20% DEET.  Wearing long pants and shirts with socks and shoes.  Wear dark or drab-colored clothes rather than bright colors.  Avoid using perfumes and hair sprays; these attract insects.  HOME CARE ADVICE:  1. Stinger removal: The stinger looks like a tiny black dot in the sting. Use a fingernail, credit card edge, or knife-edge to scrape it off.  Don't pull it out because it squeezes out more venom. If the stinger is below the skin surface, leave it alone.  It will be shed with normal skin healing. 2. Use cold compresses to the area of the sting for 10-20 minutes.  You may repeat this as needed to relieve symptoms of pain and swelling. 3.  For pain relief, take acetominophen 650 mg 4-6 hours as needed or ibuprofen 400 mg every 6-8 hours as needed or naproxen 250-500 mg every 12 hours as needed. 4.  You can also use hydrocortisone cream 0.5% or 1% up to 4 times daily as needed for itching. 5.  If the sting becomes very itchy, take Benadryl 25-50 mg, follow directions on  box. You may also take xyzal 5 mg along with famotidine 20 mg twice daily for added and longer acting relief of itching.  6.  Wash the area 2-3 times daily with antibacterial soap and warm water. 7. Call your Doctor if: Fever, a severe headache, or rash occur in the next 2 weeks. Sting area begins to look infected. Redness and swelling worsens after home treatment. Your current symptoms become worse.    MAKE SURE YOU:  Understand these instructions. Will watch your condition. Will get help right away if you are not doing well or get worse.  Thank you for choosing an e-visit. Your e-visit answers were reviewed by a board certified advanced clinical practitioner to complete your personal care plan. Depending upon the condition, your plan could have included both over the counter or prescription medications. Please review your pharmacy choice. Be sure that the pharmacy you have chosen is open so that you can pick up your prescription now.  If there is a problem you may message your provider in [place] to have the prescription routed to another pharmacy. Your safety is important to [name]. If you have drug allergies check your prescription carefully.  For the next 24 hours, you can use MyChart to ask questions about today's visit, request a non-urgent call back, or ask for a work or school excuse from your e-visit provider. You will  get an email in the next two days asking about your experience. I hope that your e-visit has been valuable and will speed your recovery.    ===View-only below this line===   ----- Message -----    From: Tracy Burch    Sent: 07/27/2018 10:44 AM EDT      To: E-Visit Mailing List Subject: E-Visit Submission: Insect Sting  E-Visit Submission: Insect Sting --------------------------------  Question: What insect stung you? Answer:   Physiological scientist  Question: When did you get stung? Answer:   Yesterday  Question: How many times were you stung? Answer:   2-3  times  Question: Please tell me the location of the sting(s): Answer:   My right hand and it itches like crazy. I put cortisone on it and I already took a benadryl and it still itches  Question: Did you remove the stinger(s)? Answer:   No  Question: Do you have any redness, itchiness, or swelling around the sting? Answer:   All of the above  Question: Do you have any of the following since being stung? (check all that apply) Answer:   None of the above  Question: "Other" symptoms or comments: Answer:     Question: Do you have a fever? Answer:   No  Question: Is the sting area(s) painful? Answer:   Yes  Question: Please rate your pain in a scale of 1-10 with 0 being the least and 10 being the worst: Answer:   7  Question: Have you tried anything over the counter? (check all that apply) Answer:   Benadryl            Calamine Lotion            Hydrocortisone cream  Question: Have you washed the area(s) with soap and water? Answer:   Yes  Question: Is there any pus draining from the sting(s)? Answer:   Honey-yellow pus  Question: Please list your medication allergies that you may have ? (If 'none' , please list as 'none') Answer:   None  Question: Please list any additional comments  Answer:   None  Question: Are you pregnant? Answer:   I am confident that I am not pregnant  Question: Are you breastfeeding? Answer:   No  A total of 5-10 minutes was spent evaluating this patients questionnaire and formulating a plan of care.

## 2018-08-03 ENCOUNTER — Other Ambulatory Visit: Payer: Self-pay | Admitting: Family Medicine

## 2018-08-19 ENCOUNTER — Other Ambulatory Visit: Payer: Self-pay | Admitting: Family Medicine

## 2018-09-24 ENCOUNTER — Ambulatory Visit: Payer: Self-pay | Admitting: Family Medicine

## 2018-09-24 NOTE — Telephone Encounter (Signed)
Pt has been scheduled for a VV with Elyn Aquas on Monday.

## 2018-09-24 NOTE — Telephone Encounter (Signed)
Pt. Reports she was told at work someone tested positive and they need to see about testing or quarantine for 2 weeks. Would like to see what her PCP thinks. Warm transfer to [place] in the office .  Answer Assessment - Initial Assessment Questions 1. CLOSE CONTACT: "Who is the person with the confirmed or suspected COVID-19 infection that you were exposed to?"     Co-worker 2. PLACE of CONTACT: "Where were you when you were exposed to COVID-19?" (e.g., home, school, medical waiting room; which city?)     Was not 3. TYPE of CONTACT: "How much contact was there?" (e.g., sitting next to, live in same house, work in same office, same building)     None 4. DURATION of CONTACT: "How long were you in contact with the COVID-19 patient?" (e.g., a few seconds, passed by person, a few minutes, live with the patient)     n/a 5. DATE of CONTACT: "When did you have contact with a COVID-19 patient?" (e.g., how many days ago)     Last week 6. TRAVEL: "Have you traveled out of the country recently?" If so, "When and where?"     * Also ask about out-of-state travel, since the CDC has identified some high-risk cities for community spread in the [name].     * Note: Travel becomes less relevant if there is widespread community transmission where the patient lives.     No 7. COMMUNITY SPREAD: "Are there lots of cases of COVID-19 (community spread) where you live?" (See public health department website, if unsure)       Yes 8. SYMPTOMS: "Do you have any symptoms?" (e.g., fever, cough, breathing difficulty)     No 9. PREGNANCY OR POSTPARTUM: "Is there any chance you are pregnant?" "When was your last menstrual period?" "Did you deliver in the last 2 weeks?"     No 10. HIGH RISK: "Do you have any heart or lung problems? Do you have a weak immune system?" (e.g., CHF, COPD, asthma, HIV positive, chemotherapy, renal failure, diabetes mellitus, sickle cell anemia)       No  Protocols used: CORONAVIRUS (COVID-19)  EXPOSURE-A-AH

## 2018-09-24 NOTE — Telephone Encounter (Signed)
FYI

## 2018-09-27 ENCOUNTER — Encounter: Payer: Self-pay | Admitting: Physician Assistant

## 2018-09-27 ENCOUNTER — Ambulatory Visit (INDEPENDENT_AMBULATORY_CARE_PROVIDER_SITE_OTHER): Payer: Managed Care, Other (non HMO) | Admitting: Physician Assistant

## 2018-09-27 ENCOUNTER — Other Ambulatory Visit: Payer: Self-pay

## 2018-09-27 VITALS — Ht 64.0 in | Wt 191.0 lb

## 2018-09-27 DIAGNOSIS — Z20828 Contact with and (suspected) exposure to other viral communicable diseases: Secondary | ICD-10-CM

## 2018-09-27 DIAGNOSIS — Z20822 Contact with and (suspected) exposure to covid-19: Secondary | ICD-10-CM

## 2018-09-27 NOTE — Progress Notes (Signed)
   Virtual Visit via Video   I connected with patient on 09/27/18 at  9:00 AM EDT by a video enabled telemedicine application and verified that I am speaking with the correct person using two identifiers.  Location patient: Home Location provider: [facility] Persons participating in the virtual visit: Patient, Provider, [place] (Patina Moore)  I discussed the limitations of evaluation and management by telemedicine and the availability of in person appointments. The patient expressed understanding and agreed to proceed.  Subjective:   HPI:  Patient presents via [employer] today to discuss potential COVID exposure and work and if there is need for testing. Patient notes Friday she found out that a coworker was positive for COVID. Last time worker was at work was last Thursday. Notes they have been in the same building but denies any known close exposure to this person. Office has closed for 2 weeks, allowing people to work at home while safe quarantining just to be safe. Patient denies fever, chills, malaise or fatigue. Denies GI symptoms, URI symptoms or change in taste or smell. States she is feeling just fine.   ROS:   See pertinent positives and negatives per HPI.  Patient Active Problem List   Diagnosis Date Noted  . Vitamin D deficiency 11/18/2016  . Obesity (BMI 30.0-34.9) 11/18/2016  . Anxiety 11/18/2016  . Patellofemoral arthralgia of left knee 05/27/2013  . Numerous moles 05/27/2013  . Insomnia 12/04/2011  . Physical exam, annual 10/22/2011    Social History   Tobacco Use  . Smoking status: Never Smoker  . Smokeless tobacco: Never Used  Substance Use Topics  . Alcohol use: Yes    Comment: ocassionally    Current Outpatient Medications:  .  fluticasone (FLONASE) 50 MCG/ACT nasal spray, Place 2 sprays into both nostrils daily., Disp: 16 g, Rfl: 6 .  levonorgestrel-ethinyl estradiol (SEASONALE,INTROVALE,JOLESSA) 0.15-0.03 MG tablet, Take 1 tablet by mouth  daily., Disp: 1 Package, Rfl: 11 .  montelukast (SINGULAIR) 10 MG tablet, Take 1 tablet (10 mg total) by mouth at bedtime. Please call [phone] to schedule your physical in August., Disp: 90 tablet, Rfl: 0 .  sertraline (ZOLOFT) 25 MG tablet, Take 1 tablet by mouth once daily, Disp: 30 tablet, Rfl: 6 .  zolpidem (AMBIEN) 5 MG tablet, TAKE 1 TABLET BY MOUTH AT BEDTIME AS NEEDED FOR SLEEP, Disp: 30 tablet, Rfl: 3  No Known Allergies  Objective:   Ht 5\' 4"  (1.626 m)   Wt 191 lb (86.6 kg)   BMI 32.79 kg/m   Patient is well-developed, well-nourished in no acute distress.  Resting comfortably at home.  Head is normocephalic, atraumatic.  No labored breathing.  Speech is clear and coherent with logical contest.  Patient is alert and oriented at baseline.   Assessment and Plan:   1. Exposure to Covid-19 Virus Questionable exposure at work. Employer is allowing employees to work at home for 2 weeks so that they can self-quarantine. No indication for testing at present. Will enroll patient in COVID monitoring program. Monitor for symptoms and will intervene if symptomatic.   - MyChart COVID-19 home monitoring program; Future - Temperature monitoring; Future   Leeanne Rio, PA-C 09/27/2018

## 2018-09-27 NOTE — Progress Notes (Signed)
I have discussed the procedure for the virtual visit with the patient who has given consent to proceed with assessment and treatment.   Treylon Henard S Phillipa Morden, CMA     

## 2018-11-05 ENCOUNTER — Other Ambulatory Visit: Payer: Self-pay | Admitting: Family Medicine

## 2018-11-26 ENCOUNTER — Ambulatory Visit (INDEPENDENT_AMBULATORY_CARE_PROVIDER_SITE_OTHER): Payer: Managed Care, Other (non HMO) | Admitting: Family Medicine

## 2018-11-26 ENCOUNTER — Other Ambulatory Visit: Payer: Self-pay

## 2018-11-26 ENCOUNTER — Encounter: Payer: Self-pay | Admitting: Family Medicine

## 2018-11-26 VITALS — BP 130/80 | HR 82 | Temp 98.1°F | Resp 16 | Ht 64.0 in | Wt 199.0 lb

## 2018-11-26 DIAGNOSIS — E669 Obesity, unspecified: Secondary | ICD-10-CM

## 2018-11-26 DIAGNOSIS — Z0000 Encounter for general adult medical examination without abnormal findings: Secondary | ICD-10-CM | POA: Diagnosis not present

## 2018-11-26 DIAGNOSIS — E559 Vitamin D deficiency, unspecified: Secondary | ICD-10-CM

## 2018-11-26 LAB — CBC WITH DIFFERENTIAL/PLATELET
Basophils Absolute: 0.1 10*3/uL (ref 0.0–0.1)
Basophils Relative: 0.6 % (ref 0.0–3.0)
Eosinophils Absolute: 0.4 10*3/uL (ref 0.0–0.7)
Eosinophils Relative: 4.5 % (ref 0.0–5.0)
HCT: 33.5 % — ABNORMAL LOW (ref 36.0–46.0)
Hemoglobin: 11 g/dL — ABNORMAL LOW (ref 12.0–15.0)
Lymphocytes Relative: 40.7 % (ref 12.0–46.0)
Lymphs Abs: 3.8 10*3/uL (ref 0.7–4.0)
MCHC: 32.8 g/dL (ref 30.0–36.0)
MCV: 81.5 fl (ref 78.0–100.0)
Monocytes Absolute: 0.5 10*3/uL (ref 0.1–1.0)
Monocytes Relative: 5.4 % (ref 3.0–12.0)
Neutro Abs: 4.5 10*3/uL (ref 1.4–7.7)
Neutrophils Relative %: 48.8 % (ref 43.0–77.0)
Platelets: 393 10*3/uL (ref 150.0–400.0)
RBC: 4.11 Mil/uL (ref 3.87–5.11)
RDW: 15.9 % — ABNORMAL HIGH (ref 11.5–15.5)
WBC: 9.2 10*3/uL (ref 4.0–10.5)

## 2018-11-26 LAB — HEPATIC FUNCTION PANEL
ALT: 9 U/L (ref 0–35)
AST: 13 U/L (ref 0–37)
Albumin: 3.8 g/dL (ref 3.5–5.2)
Alkaline Phosphatase: 82 U/L (ref 39–117)
Bilirubin, Direct: 0.1 mg/dL (ref 0.0–0.3)
Total Bilirubin: 0.3 mg/dL (ref 0.2–1.2)
Total Protein: 7.4 g/dL (ref 6.0–8.3)

## 2018-11-26 LAB — LIPID PANEL
Cholesterol: 191 mg/dL (ref 0–200)
HDL: 55.4 mg/dL (ref >39.00)
LDL Cholesterol: 124 mg/dL — ABNORMAL HIGH (ref 0–99)
NonHDL: 135.51
Total CHOL/HDL Ratio: 3
Triglycerides: 60 mg/dL (ref 0.0–149.0)
VLDL: 12 mg/dL (ref 0.0–40.0)

## 2018-11-26 LAB — BASIC METABOLIC PANEL
BUN: 7 mg/dL (ref 6–23)
CO2: 28 mEq/L (ref 19–32)
Calcium: 9.2 mg/dL (ref 8.4–10.5)
Chloride: 104 mEq/L (ref 96–112)
Creatinine, Ser: 0.62 mg/dL (ref 0.40–1.20)
GFR: 131.29 mL/min (ref >60.00)
Glucose, Bld: 52 mg/dL — ABNORMAL LOW (ref 70–99)
Potassium: 3.9 mEq/L (ref 3.5–5.1)
Sodium: 138 mEq/L (ref 135–145)

## 2018-11-26 LAB — TSH: TSH: 1.31 u[IU]/mL (ref 0.35–4.50)

## 2018-11-26 LAB — VITAMIN D 25 HYDROXY (VIT D DEFICIENCY, FRACTURES): VITD: 18.68 ng/mL — ABNORMAL LOW (ref 30.00–100.00)

## 2018-11-26 MED ORDER — CEPHALEXIN 500 MG PO CAPS: 500.0000 mg | ORAL_CAPSULE | Freq: Two times a day (BID) | ORAL | 0 refills | Status: AC

## 2018-11-26 NOTE — Assessment & Plan Note (Signed)
Pt's PE WNL w/ exception of obesity.  UTD on GYN, Tdap.  Declines flu.  Check labs.  Anticipatory guidance provided.

## 2018-11-26 NOTE — Assessment & Plan Note (Signed)
Pt has hx of this.  Check labs and replete prn. 

## 2018-11-26 NOTE — Patient Instructions (Addendum)
Follow up in 1 year or as needed We'll notify you of your lab results and make any changes if needed Continue to work on healthy diet and regular exercise- you can do it!! Call with any questions or concerns Stay Safe!!! 

## 2018-11-26 NOTE — Progress Notes (Signed)
   Subjective:    Patient ID: Tracy Burch, female    DOB: January 17, 1981, 38 y.o.   MRN: [id number]  HPI CPE- UTD on Tdap.  Declines flu.  Has gained 8 lbs since last visit.  UTD on GYN Earnstine Regal).  No concerns today except UTI   Review of Systems Patient reports no vision/ hearing changes, adenopathy,fever, weight change,  persistant/recurrent hoarseness , swallowing issues, chest pain, palpitations, edema, persistant/recurrent cough, hemoptysis, dyspnea (rest/exertional/paroxysmal nocturnal), gastrointestinal bleeding (melena, rectal bleeding), abdominal pain, significant heartburn, bowel changes, Gyn symptoms (abnormal  bleeding, pain),  syncope, focal weakness, memory loss, numbness & tingling, skin/hair/nail changes, abnormal bruising or bleeding, anxiety, or depression.   UTI- pt was tx'd w/ Macrobid but continues to have hesitancy, frequency, dysuria.  Finished abx yesterday.  'I just feel like there's no relief'.    Objective:   Physical Exam General Appearance:    Alert, cooperative, no distress, appears stated age, obese  Head:    Normocephalic, without obvious abnormality, atraumatic  Eyes:    PERRL, conjunctiva/corneas clear, EOM's intact, fundi    benign, both eyes  Ears:    Normal TM's and external ear canals, both ears  Nose:   Deferred due to COVID  Throat:   Neck:   Supple, symmetrical, trachea midline, no adenopathy;    Thyroid: no enlargement/tenderness/nodules  Back:     Symmetric, no curvature, ROM normal, no CVA tenderness  Lungs:     Clear to auscultation bilaterally, respirations unlabored  Chest Wall:    No tenderness or deformity   Heart:    Regular rate and rhythm, S1 and S2 normal, no murmur, rub   or gallop  Breast Exam:    Deferred to GYN  Abdomen:     Soft, non-tender, bowel sounds active all four quadrants,    no masses, no organomegaly  Genitalia:    Deferred to GYN  Rectal:    Extremities:   Extremities normal, atraumatic, no cyanosis or  edema  Pulses:   2+ and symmetric all extremities  Skin:   Skin color, texture, turgor normal, no rashes or lesions  Lymph nodes:   Cervical, supraclavicular, and axillary nodes normal  Neurologic:   CNII-XII intact, normal strength, sensation and reflexes    throughout          Assessment & Plan:

## 2018-11-26 NOTE — Assessment & Plan Note (Signed)
Deteriorated.  Has gained 8 lbs since last visit.  Stressed need for healthy diet and regular exercise.  Will follow.

## 2018-11-29 ENCOUNTER — Other Ambulatory Visit: Payer: Self-pay | Admitting: General Practice

## 2018-11-29 MED ORDER — VITAMIN D (ERGOCALCIFEROL) 1.25 MG (50000 UNIT) PO CAPS: 50000.0000 [IU] | ORAL_CAPSULE | ORAL | 0 refills | Status: DC

## 2018-12-16 ENCOUNTER — Encounter: Payer: Self-pay | Admitting: Family Medicine

## 2018-12-16 MED ORDER — ZOLPIDEM TARTRATE 10 MG PO TABS: 10.0000 mg | ORAL_TABLET | Freq: Every evening | ORAL | 1 refills | Status: DC | PRN

## 2018-12-16 MED ORDER — SERTRALINE HCL 50 MG PO TABS: 50.0000 mg | ORAL_TABLET | Freq: Every day | ORAL | 3 refills | Status: DC

## 2019-01-11 ENCOUNTER — Other Ambulatory Visit: Payer: Self-pay

## 2019-01-11 ENCOUNTER — Encounter: Payer: Self-pay | Admitting: Family Medicine

## 2019-01-11 ENCOUNTER — Ambulatory Visit (INDEPENDENT_AMBULATORY_CARE_PROVIDER_SITE_OTHER): Payer: Managed Care, Other (non HMO) | Admitting: Family Medicine

## 2019-01-11 VITALS — Temp 97.1°F | Ht 64.0 in | Wt 194.0 lb

## 2019-01-11 DIAGNOSIS — F419 Anxiety disorder, unspecified: Secondary | ICD-10-CM

## 2019-01-11 DIAGNOSIS — G4700 Insomnia, unspecified: Secondary | ICD-10-CM | POA: Diagnosis not present

## 2019-01-11 NOTE — Progress Notes (Signed)
   Virtual Visit via Video   I connected with patient on 01/11/19 at  8:30 AM EDT by a video enabled telemedicine application and verified that I am speaking with the correct person using two identifiers.  Location patient: Home Location provider: [facility], Office Persons participating in the virtual visit: Patient, Provider, [place] (Jess B)  I discussed the limitations of evaluation and management by telemedicine and the availability of in person appointments. The patient expressed understanding and agreed to proceed.  Subjective:   HPI:  Anxiety- ongoing issue.  Sertraline was increased to 50mg  daily.  Pt reports 'absolutely' feeling better.  Anxiety is more manageable.  Less tearful, less irritable.  'I feel so much better'.  Is seeing therapist.  Insomnia- Ambien was increased to 10mg  nightly.  Sleeping better- taking Ambien nightly.    ROS:   See pertinent positives and negatives per HPI.  Patient Active Problem List   Diagnosis Date Noted  . Vitamin D deficiency 11/18/2016  . Obesity (BMI 30.0-34.9) 11/18/2016  . Anxiety 11/18/2016  . Patellofemoral arthralgia of left knee 05/27/2013  . Numerous moles 05/27/2013  . Insomnia 12/04/2011  . Physical exam, annual 10/22/2011    Social History   Tobacco Use  . Smoking status: Never Smoker  . Smokeless tobacco: Never Used  Substance Use Topics  . Alcohol use: Yes    Comment: ocassionally    Current Outpatient Medications:  .  fluticasone (FLONASE) 50 MCG/ACT nasal spray, Place 2 sprays into both nostrils daily., Disp: 16 g, Rfl: 6 .  levonorgestrel-ethinyl estradiol (SEASONALE,INTROVALE,JOLESSA) 0.15-0.03 MG tablet, Take 1 tablet by mouth daily., Disp: 1 Package, Rfl: 11 .  montelukast (SINGULAIR) 10 MG tablet, TAKE 1 TABLET BY MOUTH AT BEDTIME(PLEASE CALL [phone] TO SCHEDULE YOUR PHYSICAL IN [place]), Disp: 90 tablet, Rfl: 0 .  sertraline (ZOLOFT) 50 MG tablet, Take 1 tablet (50 mg total) by mouth daily.,  Disp: 30 tablet, Rfl: 3 .  Vitamin D, Ergocalciferol, (DRISDOL) 1.25 MG (50000 UT) CAPS capsule, Take 1 capsule (50,000 Units total) by mouth every 7 (seven) days., Disp: 12 capsule, Rfl: 0 .  zolpidem (AMBIEN) 10 MG tablet, Take 1 tablet (10 mg total) by mouth at bedtime as needed for sleep., Disp: 30 tablet, Rfl: 1  No Known Allergies  Objective:   Temp (!) 97.1 F (36.2 C) (Oral)   Ht 5\' 4"  (1.626 m)   Wt 194 lb (88 kg)   BMI 33.30 kg/m   AAOx3, NAD NCAT, EOMI No obvious CN deficits Coloring WNL Pt is able to speak clearly, coherently without shortness of breath or increased work of breathing.  Thought process is linear.  Mood is appropriate.   Assessment and Plan:   Anxiety- much improved w/ increased dose of Sertraline.  She reports feeling 'great'.  Not interested in med changes at this time.  Will continue to follow.  Insomnia- much better w/ increased dose of Ambien.  No changes at this time.   Annye Asa, MD 01/11/2019

## 2019-01-11 NOTE — Progress Notes (Signed)
I have discussed the procedure for the virtual visit with the patient who has given consent to proceed with assessment and treatment.   Laparis Durrett L Esaul Dorwart, CMA     

## 2019-02-12 ENCOUNTER — Other Ambulatory Visit: Payer: Self-pay | Admitting: Family Medicine

## 2019-02-13 ENCOUNTER — Other Ambulatory Visit: Payer: Self-pay | Admitting: Family Medicine

## 2019-02-14 NOTE — Telephone Encounter (Signed)
Last OV 01/11/19 ambien last filled 12/16/18 #30 with 1

## 2019-03-15 ENCOUNTER — Other Ambulatory Visit: Payer: Self-pay | Admitting: Family Medicine

## 2019-03-15 NOTE — Telephone Encounter (Signed)
Last OV 01/11/19 Zolpidem last filled 02/14/19 #30 with 0

## 2019-04-15 ENCOUNTER — Other Ambulatory Visit: Payer: Self-pay | Admitting: Family Medicine

## 2019-05-16 ENCOUNTER — Other Ambulatory Visit: Payer: Self-pay | Admitting: Family Medicine

## 2019-05-24 DIAGNOSIS — Z113 Encounter for screening for infections with a predominantly sexual mode of transmission: Secondary | ICD-10-CM | POA: Diagnosis not present

## 2019-05-24 DIAGNOSIS — Z01419 Encounter for gynecological examination (general) (routine) without abnormal findings: Secondary | ICD-10-CM | POA: Diagnosis not present

## 2019-05-24 DIAGNOSIS — Z3041 Encounter for surveillance of contraceptive pills: Secondary | ICD-10-CM | POA: Diagnosis not present

## 2019-05-24 DIAGNOSIS — N951 Menopausal and female climacteric states: Secondary | ICD-10-CM | POA: Diagnosis not present

## 2019-05-24 DIAGNOSIS — N9411 Superficial (introital) dyspareunia: Secondary | ICD-10-CM | POA: Diagnosis not present

## 2019-05-24 DIAGNOSIS — Z6832 Body mass index (BMI) 32.0-32.9, adult: Secondary | ICD-10-CM | POA: Diagnosis not present

## 2019-05-26 ENCOUNTER — Other Ambulatory Visit: Payer: Self-pay | Admitting: Family Medicine

## 2019-06-29 ENCOUNTER — Other Ambulatory Visit: Payer: Self-pay | Admitting: Emergency Medicine

## 2019-06-29 ENCOUNTER — Telehealth: Payer: Self-pay | Admitting: Family Medicine

## 2019-06-29 ENCOUNTER — Other Ambulatory Visit: Payer: Self-pay | Admitting: Family Medicine

## 2019-06-29 ENCOUNTER — Other Ambulatory Visit: Payer: Self-pay | Admitting: Physician Assistant

## 2019-06-29 MED ORDER — FLUTICASONE PROPIONATE 50 MCG/ACT NA SUSP: 2.0000 | Freq: Every day | NASAL | 6 refills | Status: DC

## 2019-06-29 NOTE — Telephone Encounter (Signed)
Rx request came in separately. This has been handled.

## 2019-06-29 NOTE — Telephone Encounter (Signed)
Pt called in asking for a refill on the Flonase, pt uses [facility] on [address] , pt is aware that [facility] is out of the office

## 2019-07-11 ENCOUNTER — Other Ambulatory Visit: Payer: Self-pay | Admitting: Family Medicine

## 2019-07-11 NOTE — Telephone Encounter (Signed)
Last OV 01/11/19 Zolpidem last filled 03/15/19 #30 with 3

## 2019-08-11 ENCOUNTER — Other Ambulatory Visit: Payer: Self-pay | Admitting: Family Medicine

## 2019-08-16 ENCOUNTER — Other Ambulatory Visit: Payer: Self-pay | Admitting: Family Medicine

## 2019-09-01 DIAGNOSIS — Z20822 Contact with and (suspected) exposure to covid-19: Secondary | ICD-10-CM | POA: Diagnosis not present

## 2019-09-01 DIAGNOSIS — Z03818 Encounter for observation for suspected exposure to other biological agents ruled out: Secondary | ICD-10-CM | POA: Diagnosis not present

## 2019-11-02 DIAGNOSIS — Z113 Encounter for screening for infections with a predominantly sexual mode of transmission: Secondary | ICD-10-CM | POA: Diagnosis not present

## 2019-11-02 DIAGNOSIS — N898 Other specified noninflammatory disorders of vagina: Secondary | ICD-10-CM | POA: Diagnosis not present

## 2019-11-17 ENCOUNTER — Other Ambulatory Visit: Payer: Self-pay | Admitting: Family Medicine

## 2019-11-17 NOTE — Telephone Encounter (Signed)
Zolpidem LFD 07/11/19 #30 with 3 refills LOV 01/11/19 NOV 12/01/19

## 2019-12-01 ENCOUNTER — Ambulatory Visit (INDEPENDENT_AMBULATORY_CARE_PROVIDER_SITE_OTHER): Payer: BC Managed Care – PPO | Admitting: Family Medicine

## 2019-12-01 ENCOUNTER — Other Ambulatory Visit: Payer: Self-pay

## 2019-12-01 ENCOUNTER — Encounter: Payer: Self-pay | Admitting: Family Medicine

## 2019-12-01 VITALS — BP 124/80 | HR 78 | Temp 98.0°F | Resp 16 | Ht 64.0 in | Wt 185.1 lb

## 2019-12-01 DIAGNOSIS — E559 Vitamin D deficiency, unspecified: Secondary | ICD-10-CM

## 2019-12-01 DIAGNOSIS — Z0000 Encounter for general adult medical examination without abnormal findings: Secondary | ICD-10-CM | POA: Diagnosis not present

## 2019-12-01 DIAGNOSIS — E669 Obesity, unspecified: Secondary | ICD-10-CM

## 2019-12-01 LAB — CBC WITH DIFFERENTIAL/PLATELET
Basophils Absolute: 0 10*3/uL (ref 0.0–0.1)
Basophils Relative: 0.2 % (ref 0.0–3.0)
Eosinophils Absolute: 0.3 10*3/uL (ref 0.0–0.7)
Eosinophils Relative: 3.7 % (ref 0.0–5.0)
HCT: 32.3 % — ABNORMAL LOW (ref 36.0–46.0)
Hemoglobin: 10.3 g/dL — ABNORMAL LOW (ref 12.0–15.0)
Lymphocytes Relative: 39.8 % (ref 12.0–46.0)
Lymphs Abs: 3 10*3/uL (ref 0.7–4.0)
MCHC: 32 g/dL (ref 30.0–36.0)
MCV: 85 fl (ref 78.0–100.0)
Monocytes Absolute: 0.3 10*3/uL (ref 0.1–1.0)
Monocytes Relative: 4.4 % (ref 3.0–12.0)
Neutro Abs: 3.9 10*3/uL (ref 1.4–7.7)
Neutrophils Relative %: 51.9 % (ref 43.0–77.0)
Platelets: 382 10*3/uL (ref 150.0–400.0)
RBC: 3.8 Mil/uL — ABNORMAL LOW (ref 3.87–5.11)
RDW: 15.9 % — ABNORMAL HIGH (ref 11.5–15.5)
WBC: 7.4 10*3/uL (ref 4.0–10.5)

## 2019-12-01 LAB — HEPATIC FUNCTION PANEL
ALT: 13 U/L (ref 0–35)
AST: 13 U/L (ref 0–37)
Albumin: 4 g/dL (ref 3.5–5.2)
Alkaline Phosphatase: 72 U/L (ref 39–117)
Bilirubin, Direct: 0.1 mg/dL (ref 0.0–0.3)
Total Bilirubin: 0.4 mg/dL (ref 0.2–1.2)
Total Protein: 7.3 g/dL (ref 6.0–8.3)

## 2019-12-01 LAB — LIPID PANEL
Cholesterol: 192 mg/dL (ref 0–200)
HDL: 56.2 mg/dL (ref >39.00)
LDL Cholesterol: 125 mg/dL — ABNORMAL HIGH (ref 0–99)
NonHDL: 135.35
Total CHOL/HDL Ratio: 3
Triglycerides: 54 mg/dL (ref 0.0–149.0)
VLDL: 10.8 mg/dL (ref 0.0–40.0)

## 2019-12-01 LAB — BASIC METABOLIC PANEL
BUN: 9 mg/dL (ref 6–23)
CO2: 28 mEq/L (ref 19–32)
Calcium: 9.5 mg/dL (ref 8.4–10.5)
Chloride: 102 mEq/L (ref 96–112)
Creatinine, Ser: 0.72 mg/dL (ref 0.40–1.20)
GFR: 109.87 mL/min (ref >60.00)
Glucose, Bld: 90 mg/dL (ref 70–99)
Potassium: 3.8 mEq/L (ref 3.5–5.1)
Sodium: 138 mEq/L (ref 135–145)

## 2019-12-01 LAB — VITAMIN D 25 HYDROXY (VIT D DEFICIENCY, FRACTURES): VITD: 24.4 ng/mL — ABNORMAL LOW (ref 30.00–100.00)

## 2019-12-01 LAB — TSH: TSH: 0.78 u[IU]/mL (ref 0.35–4.50)

## 2019-12-01 NOTE — Assessment & Plan Note (Signed)
Pt is down 10 lbs since last visit.  Applauded her efforts.  Check labs to risk stratify.

## 2019-12-01 NOTE — Progress Notes (Signed)
   Subjective:    Patient ID: Chioma Mukherjee, female    DOB: [birth year], 39 y.o.   MRN: [id number]  HPI CPE- UTD on pap, COVID vaccines, Tdap.  Declines flu shot.  Pt is down 10 lbs since last visit.  'I feel great'.  Reviewed past medical, surgical, family and social histories.   Health Maintenance  Topic Date Due  . INFLUENZA VACCINE  06/28/2020 (Originally 10/30/2019)  . Hepatitis C Screening  11/30/2020 (Originally 21-Mar-1981)  . HIV Screening  11/30/2020 (Originally 03/23/1997)  . PAP SMEAR-Modifier  05/02/2020  . TETANUS/TDAP  10/27/2022  . COVID-19 Vaccine  Completed     Review of Systems Patient reports no vision/ hearing changes, adenopathy,fever, persistant/recurrent hoarseness , swallowing issues, chest pain, palpitations, edema, persistant/recurrent cough, hemoptysis, dyspnea (rest/exertional/paroxysmal nocturnal), gastrointestinal bleeding (melena, rectal bleeding), abdominal pain, significant heartburn, bowel changes, GU symptoms (dysuria, hematuria, incontinence), Gyn symptoms (abnormal  bleeding, pain),  syncope, focal weakness, memory loss, numbness & tingling, skin/hair/nail changes, abnormal bruising or bleeding, anxiety, or depression.     Objective:   Physical Exam General Appearance:    Alert, cooperative, no distress, appears stated age  Head:    Normocephalic, without obvious abnormality, atraumatic  Eyes:    PERRL, conjunctiva/corneas clear, EOM's intact, fundi    benign, both eyes  Ears:    Normal TM's and external ear canals, both ears  Nose:   Deferred due to COVID  Throat:   Neck:   Supple, symmetrical, trachea midline, no adenopathy;    Thyroid: no enlargement/tenderness/nodules  Back:     Symmetric, no curvature, ROM normal, no CVA tenderness  Lungs:     Clear to auscultation bilaterally, respirations unlabored  Chest Wall:    No tenderness or deformity   Heart:    Regular rate and rhythm, S1 and S2 normal, no murmur, rub   or gallop  Breast  Exam:    Deferred to GYN  Abdomen:     Soft, non-tender, bowel sounds active all four quadrants,    no masses, no organomegaly  Genitalia:    Deferred to GYN  Rectal:    Extremities:   Extremities normal, atraumatic, no cyanosis or edema  Pulses:   2+ and symmetric all extremities  Skin:   Skin color, texture, turgor normal, no rashes or lesions  Lymph nodes:   Cervical, supraclavicular, and axillary nodes normal  Neurologic:   CNII-XII intact, normal strength, sensation and reflexes    throughout          Assessment & Plan:

## 2019-12-01 NOTE — Assessment & Plan Note (Signed)
Pt's PE WNL w/ exception of obesity.  UTD on COVID, Tdap, pap.  Declines flu.  Check labs.  Anticipatory guidance provided.

## 2019-12-01 NOTE — Patient Instructions (Signed)
Follow up in 1 year or as needed We'll notify you of your lab results and make any changes if needed Keep up the good work on healthy diet and regular exercise- you're doing great!! Call with any questions or concerns Stay Safe!  Stay Healthy!!! 

## 2019-12-01 NOTE — Assessment & Plan Note (Signed)
Check labs and replete prn. 

## 2019-12-02 ENCOUNTER — Other Ambulatory Visit: Payer: Self-pay | Admitting: General Practice

## 2019-12-02 MED ORDER — VITAMIN D (ERGOCALCIFEROL) 1.25 MG (50000 UNIT) PO CAPS: 50000.0000 [IU] | ORAL_CAPSULE | ORAL | 0 refills | Status: DC

## 2019-12-20 ENCOUNTER — Other Ambulatory Visit: Payer: Self-pay | Admitting: Family Medicine

## 2019-12-21 NOTE — Telephone Encounter (Signed)
Last OV 12/01/19 Zolpidem last filled 11/17/19 #30 with 0

## 2020-01-01 DIAGNOSIS — Z20822 Contact with and (suspected) exposure to covid-19: Secondary | ICD-10-CM | POA: Diagnosis not present

## 2020-01-08 ENCOUNTER — Other Ambulatory Visit: Payer: Self-pay | Admitting: Family Medicine

## 2020-01-20 ENCOUNTER — Other Ambulatory Visit: Payer: Self-pay | Admitting: Family Medicine

## 2020-01-20 NOTE — Telephone Encounter (Signed)
LFD 12/21/19 #30 with no refills LOV 12/01/19 NOV 12/05/20

## 2020-02-17 ENCOUNTER — Other Ambulatory Visit: Payer: Self-pay | Admitting: Family Medicine

## 2020-02-18 ENCOUNTER — Other Ambulatory Visit: Payer: Self-pay | Admitting: Family Medicine

## 2020-02-20 NOTE — Telephone Encounter (Signed)
Patient is requesting the following  Ambien 10mg   30 tabs 0 refills  Last refill 01/20/2020

## 2020-03-19 ENCOUNTER — Other Ambulatory Visit: Payer: Self-pay | Admitting: Family Medicine

## 2020-04-05 ENCOUNTER — Other Ambulatory Visit: Payer: Self-pay | Admitting: Family Medicine

## 2020-04-16 ENCOUNTER — Other Ambulatory Visit: Payer: Self-pay | Admitting: Family Medicine

## 2020-04-16 NOTE — Telephone Encounter (Signed)
Ambien last rx 03/19/20 #30 LOV: 12/01/19 CPE NOV: 12/05/20

## 2020-05-04 DIAGNOSIS — N898 Other specified noninflammatory disorders of vagina: Secondary | ICD-10-CM | POA: Diagnosis not present

## 2020-05-17 ENCOUNTER — Other Ambulatory Visit: Payer: Self-pay | Admitting: Family Medicine

## 2020-05-24 DIAGNOSIS — B009 Herpesviral infection, unspecified: Secondary | ICD-10-CM | POA: Diagnosis not present

## 2020-05-24 DIAGNOSIS — N898 Other specified noninflammatory disorders of vagina: Secondary | ICD-10-CM | POA: Diagnosis not present

## 2020-05-24 DIAGNOSIS — Z6831 Body mass index (BMI) 31.0-31.9, adult: Secondary | ICD-10-CM | POA: Diagnosis not present

## 2020-05-24 DIAGNOSIS — Z3041 Encounter for surveillance of contraceptive pills: Secondary | ICD-10-CM | POA: Diagnosis not present

## 2020-05-24 DIAGNOSIS — Z01419 Encounter for gynecological examination (general) (routine) without abnormal findings: Secondary | ICD-10-CM | POA: Diagnosis not present

## 2020-06-01 DIAGNOSIS — R030 Elevated blood-pressure reading, without diagnosis of hypertension: Secondary | ICD-10-CM | POA: Diagnosis not present

## 2020-06-01 DIAGNOSIS — R059 Cough, unspecified: Secondary | ICD-10-CM | POA: Diagnosis not present

## 2020-06-04 ENCOUNTER — Encounter: Payer: Self-pay | Admitting: Family Medicine

## 2020-06-04 ENCOUNTER — Other Ambulatory Visit: Payer: Self-pay

## 2020-06-04 ENCOUNTER — Telehealth (INDEPENDENT_AMBULATORY_CARE_PROVIDER_SITE_OTHER): Payer: BC Managed Care – PPO | Admitting: Family Medicine

## 2020-06-04 DIAGNOSIS — R058 Other specified cough: Secondary | ICD-10-CM | POA: Diagnosis not present

## 2020-06-04 MED ORDER — PREDNISONE 10 MG PO TABS: ORAL_TABLET | ORAL | 0 refills | Status: DC

## 2020-06-04 MED ORDER — ALBUTEROL SULFATE HFA 108 (90 BASE) MCG/ACT IN AERS: 2.0000 | INHALATION_SPRAY | Freq: Four times a day (QID) | RESPIRATORY_TRACT | 0 refills | Status: DC | PRN

## 2020-06-04 NOTE — Progress Notes (Signed)
I have discussed the procedure for the virtual visit with the patient who has given consent to proceed with assessment and treatment.   Tracy Burch S [name], CMA     

## 2020-06-04 NOTE — Progress Notes (Signed)
   Virtual Visit via Video   I connected with patient on 06/04/20 at 11:00 AM EST by a video enabled telemedicine application and verified that I am speaking with the correct person using two identifiers.  Location patient: Home Location provider: [facility], Office Persons participating in the virtual visit: Patient, Provider, [place] (Patina M)  I discussed the limitations of evaluation and management by telemedicine and the availability of in person appointments. The patient expressed understanding and agreed to proceed.  Subjective:   HPI:   Cough- pt reports sxs started 3-4 weeks ago.  Worsens overnight and keeps her up.  Went to [facility] on Friday and was told she had nasal congestion/swelling and should see Allergist or ENT.  No relief w/ Tessalon.  Pt reports some wheezing at night.  During the day talking will trigger her cough.  'I feel ok'.    ROS:   See pertinent positives and negatives per HPI.  Patient Active Problem List   Diagnosis Date Noted  . Vitamin D deficiency 11/18/2016  . Obesity (BMI 30.0-34.9) 11/18/2016  . Anxiety 11/18/2016  . Patellofemoral arthralgia of left knee 05/27/2013  . Numerous moles 05/27/2013  . Insomnia 12/04/2011  . Physical exam, annual 10/22/2011    Social History   Tobacco Use  . Smoking status: Never Smoker  . Smokeless tobacco: Never Used  Substance Use Topics  . Alcohol use: Yes    Comment: ocassionally    Current Outpatient Medications:  .  benzonatate (TESSALON) 100 MG capsule, Take 100 mg by mouth 3 (three) times daily as needed., Disp: , Rfl:  .  fluticasone (FLONASE) 50 MCG/ACT nasal spray, Place 2 sprays into both nostrils daily., Disp: 16 g, Rfl: 6 .  ibuprofen (ADVIL) 400 MG tablet, SMARTSIG:1-2 Tablet(s) By Mouth 3-4 Times Daily PRN, Disp: , Rfl:  .  levonorgestrel-ethinyl estradiol (SEASONALE,INTROVALE,JOLESSA) 0.15-0.03 MG tablet, Take 1 tablet by mouth daily., Disp: 1 Package, Rfl: 11 .  montelukast  (SINGULAIR) 10 MG tablet, TAKE 1 TABLET BY MOUTH AT BEDTIME(PLEASE CALL [phone] TO SCHEDULE YOUR PHYSICAL IN [place]), Disp: 90 tablet, Rfl: 0 .  sertraline (ZOLOFT) 50 MG tablet, Take 1 tablet by mouth once daily, Disp: 90 tablet, Rfl: 0 .  Vitamin D, Ergocalciferol, (DRISDOL) 1.25 MG (50000 UNIT) CAPS capsule, Take 1 capsule (50,000 Units total) by mouth every 7 (seven) days., Disp: 12 capsule, Rfl: 0 .  zolpidem (AMBIEN) 10 MG tablet, TAKE 1 TABLET BY MOUTH AT BEDTIME AS NEEDED FOR SLEEP, Disp: 30 tablet, Rfl: 3  No Known Allergies  Objective:   There were no vitals taken for this visit. AAOx3, NAD NCAT, EOMI No obvious CN deficits Coloring WNL Pt is able to speak clearly, coherently without shortness of breath or increased work of breathing.  Thought process is linear.  Mood is appropriate.   Assessment and Plan:   Post-infectious cough- new.  Reviewed dx and tx w/ pt.  Will start Prednisone taper to improve airway inflammation.  This should also improve nasal congestion and PND.  She is to use Albuterol as needed for coughing fits or wheezing.  Reviewed supportive care and red flags that should prompt return.  Pt expressed understanding and is in agreement w/ plan.   Annye Asa, MD 06/04/2020

## 2020-06-24 ENCOUNTER — Other Ambulatory Visit: Payer: Self-pay | Admitting: Family Medicine

## 2020-06-25 ENCOUNTER — Other Ambulatory Visit: Payer: Self-pay | Admitting: Family Medicine

## 2020-08-20 ENCOUNTER — Other Ambulatory Visit: Payer: Self-pay | Admitting: Family Medicine

## 2020-08-20 NOTE — Telephone Encounter (Signed)
LFD 1/17/ #30 with 3 refills LOV 06/04/20 NOV 12/05/20

## 2020-08-23 ENCOUNTER — Other Ambulatory Visit: Payer: Self-pay | Admitting: Family Medicine

## 2020-09-26 ENCOUNTER — Encounter: Payer: Self-pay | Admitting: *Deleted

## 2020-10-02 ENCOUNTER — Other Ambulatory Visit: Payer: Self-pay | Admitting: Family Medicine

## 2020-10-09 ENCOUNTER — Other Ambulatory Visit: Payer: Self-pay | Admitting: Family

## 2020-11-26 DIAGNOSIS — L570 Actinic keratosis: Secondary | ICD-10-CM | POA: Diagnosis not present

## 2020-11-26 DIAGNOSIS — D1720 Benign lipomatous neoplasm of skin and subcutaneous tissue of unspecified limb: Secondary | ICD-10-CM | POA: Diagnosis not present

## 2020-11-26 DIAGNOSIS — N898 Other specified noninflammatory disorders of vagina: Secondary | ICD-10-CM | POA: Diagnosis not present

## 2020-11-30 ENCOUNTER — Other Ambulatory Visit: Payer: Self-pay | Admitting: Family Medicine

## 2020-12-05 ENCOUNTER — Encounter: Payer: BC Managed Care – PPO | Admitting: Family Medicine

## 2020-12-31 ENCOUNTER — Other Ambulatory Visit: Payer: Self-pay | Admitting: Family Medicine

## 2021-01-06 ENCOUNTER — Other Ambulatory Visit: Payer: Self-pay | Admitting: Family Medicine

## 2021-01-16 ENCOUNTER — Other Ambulatory Visit: Payer: Self-pay | Admitting: Family Medicine

## 2021-01-16 NOTE — Telephone Encounter (Signed)
Please escribe, UTD on visits

## 2021-02-20 ENCOUNTER — Encounter: Payer: BC Managed Care – PPO | Admitting: Family Medicine

## 2021-03-04 ENCOUNTER — Other Ambulatory Visit: Payer: Self-pay | Admitting: Family Medicine

## 2021-03-04 NOTE — Telephone Encounter (Signed)
Patient is requesting a refill of the following medications: Requested Prescriptions   Pending Prescriptions Disp Refills   zolpidem (AMBIEN) 10 MG tablet [Pharmacy Med Name: Zolpidem Tartrate 10 MG Oral Tablet] 30 tablet 0    Sig: TAKE 1 TABLET BY MOUTH AT BEDTIME AS NEEDED FOR SLEEP    Date of patient request: 03/04/2021 Last office visit: 12/01/2019 Date of last refill: 01/16/2021 Last refill amount: 30 tablets  Follow up time period per chart: 04/11/2021

## 2021-03-26 ENCOUNTER — Other Ambulatory Visit: Payer: Self-pay | Admitting: Family Medicine

## 2021-04-03 ENCOUNTER — Encounter: Payer: BC Managed Care – PPO | Admitting: Registered Nurse

## 2021-04-06 ENCOUNTER — Other Ambulatory Visit: Payer: Self-pay | Admitting: Family Medicine

## 2021-04-11 ENCOUNTER — Encounter: Payer: Self-pay | Admitting: Family Medicine

## 2021-04-11 ENCOUNTER — Ambulatory Visit (INDEPENDENT_AMBULATORY_CARE_PROVIDER_SITE_OTHER): Payer: BC Managed Care – PPO | Admitting: Family Medicine

## 2021-04-11 VITALS — BP 160/100 | HR 82 | Temp 98.4°F | Resp 16 | Ht 64.0 in | Wt 185.0 lb

## 2021-04-11 DIAGNOSIS — R069 Unspecified abnormalities of breathing: Secondary | ICD-10-CM

## 2021-04-11 DIAGNOSIS — I10 Essential (primary) hypertension: Secondary | ICD-10-CM | POA: Insufficient documentation

## 2021-04-11 DIAGNOSIS — H6123 Impacted cerumen, bilateral: Secondary | ICD-10-CM | POA: Diagnosis not present

## 2021-04-11 DIAGNOSIS — Z0000 Encounter for general adult medical examination without abnormal findings: Secondary | ICD-10-CM | POA: Diagnosis not present

## 2021-04-11 DIAGNOSIS — E559 Vitamin D deficiency, unspecified: Secondary | ICD-10-CM

## 2021-04-11 DIAGNOSIS — R030 Elevated blood-pressure reading, without diagnosis of hypertension: Secondary | ICD-10-CM | POA: Diagnosis not present

## 2021-04-11 LAB — CBC WITH DIFFERENTIAL/PLATELET
Basophils Absolute: 0.2 10*3/uL — ABNORMAL HIGH (ref 0.0–0.1)
Basophils Relative: 2 % (ref 0.0–3.0)
Eosinophils Absolute: 0.3 10*3/uL (ref 0.0–0.7)
Eosinophils Relative: 4 % (ref 0.0–5.0)
HCT: 35.2 % — ABNORMAL LOW (ref 36.0–46.0)
Hemoglobin: 11 g/dL — ABNORMAL LOW (ref 12.0–15.0)
Lymphocytes Relative: 39.7 % (ref 12.0–46.0)
Lymphs Abs: 3.1 10*3/uL (ref 0.7–4.0)
MCHC: 31.3 g/dL (ref 30.0–36.0)
MCV: 84.1 fl (ref 78.0–100.0)
Monocytes Absolute: 0.4 10*3/uL (ref 0.1–1.0)
Monocytes Relative: 4.8 % (ref 3.0–12.0)
Neutro Abs: 3.8 10*3/uL (ref 1.4–7.7)
Neutrophils Relative %: 49.5 % (ref 43.0–77.0)
Platelets: 387 10*3/uL (ref 150.0–400.0)
RBC: 4.19 Mil/uL (ref 3.87–5.11)
RDW: 15.7 % — ABNORMAL HIGH (ref 11.5–15.5)
WBC: 7.7 10*3/uL (ref 4.0–10.5)

## 2021-04-11 LAB — BASIC METABOLIC PANEL
BUN: 7 mg/dL (ref 6–23)
CO2: 28 mEq/L (ref 19–32)
Calcium: 9.4 mg/dL (ref 8.4–10.5)
Chloride: 103 mEq/L (ref 96–112)
Creatinine, Ser: 0.76 mg/dL (ref 0.40–1.20)
GFR: 98.96 mL/min (ref >60.00)
Glucose, Bld: 88 mg/dL (ref 70–99)
Potassium: 3.5 mEq/L (ref 3.5–5.1)
Sodium: 138 mEq/L (ref 135–145)

## 2021-04-11 LAB — LIPID PANEL
Cholesterol: 184 mg/dL (ref 0–200)
HDL: 62.1 mg/dL (ref >39.00)
LDL Cholesterol: 110 mg/dL — ABNORMAL HIGH (ref 0–99)
NonHDL: 122.23
Total CHOL/HDL Ratio: 3
Triglycerides: 59 mg/dL (ref 0.0–149.0)
VLDL: 11.8 mg/dL (ref 0.0–40.0)

## 2021-04-11 LAB — TSH: TSH: 1.96 u[IU]/mL (ref 0.35–5.50)

## 2021-04-11 LAB — HEPATIC FUNCTION PANEL
ALT: 11 U/L (ref 0–35)
AST: 13 U/L (ref 0–37)
Albumin: 4 g/dL (ref 3.5–5.2)
Alkaline Phosphatase: 71 U/L (ref 39–117)
Bilirubin, Direct: 0.1 mg/dL (ref 0.0–0.3)
Total Bilirubin: 0.5 mg/dL (ref 0.2–1.2)
Total Protein: 7.8 g/dL (ref 6.0–8.3)

## 2021-04-11 LAB — VITAMIN D 25 HYDROXY (VIT D DEFICIENCY, FRACTURES): VITD: 26.35 ng/mL — ABNORMAL LOW (ref 30.00–100.00)

## 2021-04-11 MED ORDER — HYDROCHLOROTHIAZIDE 12.5 MG PO TABS: 12.5000 mg | ORAL_TABLET | Freq: Every day | ORAL | 3 refills | Status: DC

## 2021-04-11 NOTE — Progress Notes (Signed)
° °  Subjective:    Patient ID: Tracy Burch, female    DOB: 01-29-1981, 41 y.o.   MRN: [id number]  HPI CPE- UTD on pap, Tdap.  Health Maintenance  Topic Date Due   Pneumococcal Vaccine 80-36 Years old (1 - PCV) Never done   COVID-19 Vaccine (5 - Booster) 04/28/2020   Hepatitis C Screening  04/11/2022 (Originally 03/23/2000)   HIV Screening  04/11/2022 (Originally 03/23/1997)   PAP SMEAR-Modifier  05/02/2022   TETANUS/TDAP  10/27/2022   INFLUENZA VACCINE  Completed   HPV VACCINES  Aged Out    + elevated BP- pt has never had elevated readings.  States work is very stressful.  Work has been really hard since November.  Is just getting back into her routine after taking off for the holidays so there is increased work.  Does note severe headaches- will wake her from sleep.   Review of Systems Patient reports no vision changes, adenopathy,fever, weight change,  persistant/recurrent hoarseness , swallowing issues, chest pain, palpitations, edema, persistant/recurrent cough, hemoptysis, dyspnea (rest/exertional/paroxysmal nocturnal), gastrointestinal bleeding (melena, rectal bleeding), abdominal pain, significant heartburn, bowel changes, GU symptoms (dysuria, hematuria, incontinence), Gyn symptoms (abnormal  bleeding, pain),  syncope, focal weakness, memory loss, numbness & tingling, skin/hair/nail changes, abnormal bruising or bleeding, anxiety, or depression.   + decreased hearing in L ear, feels as if it needs to pop  This visit occurred during the SARS-CoV-2 public health emergency.  Safety protocols were in place, including screening questions prior to the visit, additional usage of staff PPE, and extensive cleaning of exam room while observing appropriate contact time as indicated for disinfecting solutions.      Objective:   Physical Exam General Appearance:    Alert, cooperative, no distress, appears stated age  Head:    Normocephalic, without obvious abnormality, atraumatic   Eyes:    PERRL, conjunctiva/corneas clear, EOM's intact, fundi    benign, both eyes  Ears:    TMs obscured by cerumen bilaterally, successfully irrigated w/ pt consent, TMs WNL bilaterally  Nose:   Nares normal, septum midline, mucosa normal, no drainage    or sinus tenderness  Throat:   Lips, mucosa, and tongue normal; teeth and gums normal  Neck:   Supple, symmetrical, trachea midline, no adenopathy;    Thyroid: no enlargement/tenderness/nodules  Back:     Symmetric, no curvature, ROM normal, no CVA tenderness  Lungs:     Clear to auscultation bilaterally, respirations unlabored  Chest Wall:    No tenderness or deformity   Heart:    Regular rate and rhythm, S1 and S2 normal, no murmur, rub   or gallop  Breast Exam:    Deferred to GYN  Abdomen:     Soft, non-tender, bowel sounds active all four quadrants,    no masses, no organomegaly  Genitalia:    Deferred to GYN  Rectal:    Extremities:   Extremities normal, atraumatic, no cyanosis or edema  Pulses:   2+ and symmetric all extremities  Skin:   Skin color, texture, turgor normal, no rashes or lesions  Lymph nodes:   Cervical, supraclavicular, and axillary nodes normal  Neurologic:   CNII-XII intact, normal strength, sensation and reflexes    throughout          Assessment & Plan:  Cerumen impaction- new.  Bilateral.  Pt consented for ear irrigation and tolerated w/o difficulty.  Cerumen successfully removed and pt's hearing immediately improved.

## 2021-04-11 NOTE — Patient Instructions (Addendum)
Follow up in 2-3 weeks to recheck BP We'll notify you of your lab results and make any changes if needed START the hydrochlorothiazide daily (to improve BP) Try and limit your salt intake and increase your water intake to help BP We'll call you with your ENT appt for the breathing issue Call with any questions or concerns Hang in there!!!

## 2021-04-12 ENCOUNTER — Telehealth: Payer: Self-pay

## 2021-04-12 MED ORDER — VITAMIN D (ERGOCALCIFEROL) 1.25 MG (50000 UNIT) PO CAPS: 50000.0000 [IU] | ORAL_CAPSULE | ORAL | 0 refills | Status: DC

## 2021-04-12 NOTE — Telephone Encounter (Signed)
Spoke to patient she is aware of labs. She states that she will take the Vit D but it does make her constipated.

## 2021-04-12 NOTE — Telephone Encounter (Signed)
-----   Message from Midge Minium, MD sent at 04/12/2021  7:30 AM EST ----- Labs look good w/ exception of mildly low Vit D.  Based on this, we need to start prescription 50,000 units weekly x12 weeks in addition to daily OTC supplement of at least 2000 units.

## 2021-04-14 NOTE — Assessment & Plan Note (Signed)
Check labs and replete prn. 

## 2021-04-14 NOTE — Assessment & Plan Note (Signed)
Pt's PE WNL w/ exception of obesity and cerumen impaction.  UTD on pap, Tdap.  Will start mammo next year.  Check labs.  Anticipatory guidance provided.

## 2021-04-14 NOTE — Assessment & Plan Note (Signed)
New.  Pt's BP is elevated today- even on repeat assessment.  She is convinced this is due to recent work stress.  Will add low dose HCTZ to try and get BP back in normal range.  Pt expressed understanding and is in agreement w/ plan.

## 2021-04-25 ENCOUNTER — Ambulatory Visit: Payer: BC Managed Care – PPO | Admitting: Family Medicine

## 2021-04-25 ENCOUNTER — Other Ambulatory Visit: Payer: Self-pay

## 2021-04-25 ENCOUNTER — Encounter: Payer: Self-pay | Admitting: Family Medicine

## 2021-04-25 VITALS — BP 138/82 | HR 80 | Temp 98.2°F | Resp 18 | Ht 64.0 in | Wt 187.8 lb

## 2021-04-25 DIAGNOSIS — I10 Essential (primary) hypertension: Secondary | ICD-10-CM

## 2021-04-25 NOTE — Assessment & Plan Note (Signed)
New dx at last visit.  BP is much improved today and pt is feeling much better.  No longer having headaches.  Continue HCTZ.  Check BMP due to diuretic.  No anticipated med changes.  Will follow.

## 2021-04-25 NOTE — Patient Instructions (Addendum)
Follow up in 3 months to recheck blood pressure We'll notify you of your lab results and make any changes if needed Continue the HCTZ daily- BP looks MUCH better! Call with any questions or concerns Stay Safe!  Stay Healthy!!

## 2021-04-25 NOTE — Progress Notes (Signed)
° °  Subjective:    Patient ID: Tracy Burch, female    DOB: [birth year], 41 y.o.   MRN: [id number]  HPI HTN- pt was started on HCTZ b/c BP was elevated to 160/100.  HAs have completely resolved.  She feels she is managing stress better.  Denies CP, SOB, HAs, visual changes, edema.  Following a low salt diet and drinking plenty of water.  No side effects   Review of Systems For ROS see HPI   This visit occurred during the SARS-CoV-2 public health emergency.  Safety protocols were in place, including screening questions prior to the visit, additional usage of staff PPE, and extensive cleaning of exam room while observing appropriate contact time as indicated for disinfecting solutions.      Objective:   Physical Exam Vitals reviewed.  Constitutional:      General: She is not in acute distress.    Appearance: Normal appearance. She is well-developed. She is not ill-appearing.  HENT:     Head: Normocephalic and atraumatic.  Eyes:     Conjunctiva/sclera: Conjunctivae normal.     Pupils: Pupils are equal, round, and reactive to light.  Neck:     Thyroid: No thyromegaly.  Cardiovascular:     Rate and Rhythm: Normal rate and regular rhythm.     Pulses: Normal pulses.     Heart sounds: Normal heart sounds. No murmur heard. Pulmonary:     Effort: Pulmonary effort is normal. No respiratory distress.     Breath sounds: Normal breath sounds.  Abdominal:     General: There is no distension.     Palpations: Abdomen is soft.     Tenderness: There is no abdominal tenderness.  Musculoskeletal:     Cervical back: Normal range of motion and neck supple.     Right lower leg: No edema.     Left lower leg: No edema.  Lymphadenopathy:     Cervical: No cervical adenopathy.  Skin:    General: Skin is warm and dry.  Neurological:     Mental Status: She is alert and oriented to person, place, and time.  Psychiatric:        Behavior: Behavior normal.          Assessment & Plan:

## 2021-04-30 ENCOUNTER — Other Ambulatory Visit (INDEPENDENT_AMBULATORY_CARE_PROVIDER_SITE_OTHER): Payer: BC Managed Care – PPO

## 2021-04-30 DIAGNOSIS — I10 Essential (primary) hypertension: Secondary | ICD-10-CM

## 2021-04-30 LAB — BASIC METABOLIC PANEL
BUN: 10 mg/dL (ref 6–23)
CO2: 30 mEq/L (ref 19–32)
Calcium: 9.2 mg/dL (ref 8.4–10.5)
Chloride: 101 mEq/L (ref 96–112)
Creatinine, Ser: 0.74 mg/dL (ref 0.40–1.20)
GFR: 102.14 mL/min (ref >60.00)
Glucose, Bld: 100 mg/dL — ABNORMAL HIGH (ref 70–99)
Potassium: 3.5 mEq/L (ref 3.5–5.1)
Sodium: 138 mEq/L (ref 135–145)

## 2021-05-27 DIAGNOSIS — Z01419 Encounter for gynecological examination (general) (routine) without abnormal findings: Secondary | ICD-10-CM | POA: Diagnosis not present

## 2021-05-27 DIAGNOSIS — R399 Unspecified symptoms and signs involving the genitourinary system: Secondary | ICD-10-CM | POA: Diagnosis not present

## 2021-05-27 DIAGNOSIS — Z6831 Body mass index (BMI) 31.0-31.9, adult: Secondary | ICD-10-CM | POA: Diagnosis not present

## 2021-05-27 DIAGNOSIS — N898 Other specified noninflammatory disorders of vagina: Secondary | ICD-10-CM | POA: Diagnosis not present

## 2021-05-27 DIAGNOSIS — Z124 Encounter for screening for malignant neoplasm of cervix: Secondary | ICD-10-CM | POA: Diagnosis not present

## 2021-07-04 ENCOUNTER — Other Ambulatory Visit: Payer: Self-pay | Admitting: Family Medicine

## 2021-07-04 NOTE — Telephone Encounter (Signed)
MEDICATION:fluticasone (FLONASE) 50 MCG/ACT nasal spray ? ?Tracy Burch (SE), [place] [facility] [place] ? ?Comments:  ? ?**Let patient know to contact pharmacy at the end of the day to make sure medication is ready. ** ? ?** Please notify patient to allow 48-72 hours to process** ? ?**Encourage patient to contact the pharmacy for refills or they can request refills through [facility]** ?  ?

## 2021-07-06 ENCOUNTER — Other Ambulatory Visit: Payer: Self-pay | Admitting: Family Medicine

## 2021-07-26 ENCOUNTER — Ambulatory Visit: Payer: BC Managed Care – PPO | Admitting: Family Medicine

## 2021-07-26 ENCOUNTER — Encounter: Payer: Self-pay | Admitting: Family Medicine

## 2021-07-26 VITALS — BP 130/84 | HR 72 | Temp 98.6°F | Resp 18 | Ht 64.0 in | Wt 192.8 lb

## 2021-07-26 DIAGNOSIS — R058 Other specified cough: Secondary | ICD-10-CM

## 2021-07-26 DIAGNOSIS — I10 Essential (primary) hypertension: Secondary | ICD-10-CM

## 2021-07-26 DIAGNOSIS — K219 Gastro-esophageal reflux disease without esophagitis: Secondary | ICD-10-CM | POA: Diagnosis not present

## 2021-07-26 DIAGNOSIS — E669 Obesity, unspecified: Secondary | ICD-10-CM

## 2021-07-26 MED ORDER — CETIRIZINE HCL 10 MG PO TABS: 10.0000 mg | ORAL_TABLET | Freq: Every day | ORAL | 1 refills | Status: DC

## 2021-07-26 MED ORDER — OMEPRAZOLE 20 MG PO CPDR: 20.0000 mg | DELAYED_RELEASE_CAPSULE | Freq: Every day | ORAL | 3 refills | Status: DC

## 2021-07-26 NOTE — Progress Notes (Signed)
? ?  Subjective:  ? ? Patient ID: Tracy Burch, female    DOB: 1980-11-11, 41 y.o.   MRN: [id number] ? ?HPI ?HTN- recent dx.  Currently on HCTZ w/ adequate BP control.  Pt reports feeling good.  No CP, SOB, HAs, visual changes, edema. ? ?Obesity- pt has gained 5 lbs since 1/26.  Not following a particular diet.  No regular exercise. ? ?Cough- pt reports cough is waking her from sleep.  Sxs started ~1 week ago.  Currently on Singulair and Flonase.  Pt reports she will have to frequently clear her throat.  Pt is having increased GERD/indigestion. ? ? ?Review of Systems ?For ROS see HPI  ?   ?Objective:  ? Physical Exam ?Vitals reviewed.  ?Constitutional:   ?   General: She is not in acute distress. ?   Appearance: Normal appearance. She is well-developed. She is not ill-appearing.  ?HENT:  ?   Head: Normocephalic and atraumatic.  ?   Right Ear: Tympanic membrane normal.  ?   Left Ear: Tympanic membrane normal.  ?   Nose: Mucosal edema and rhinorrhea present.  ?   Right Sinus: No maxillary sinus tenderness or frontal sinus tenderness.  ?   Left Sinus: No maxillary sinus tenderness or frontal sinus tenderness.  ?   Mouth/Throat:  ?   Pharynx: Posterior oropharyngeal erythema (w/ PND) present.  ?Eyes:  ?   Conjunctiva/sclera: Conjunctivae normal.  ?   Pupils: Pupils are equal, round, and reactive to light.  ?Cardiovascular:  ?   Rate and Rhythm: Normal rate and regular rhythm.  ?   Pulses: Normal pulses.  ?   Heart sounds: Normal heart sounds.  ?Pulmonary:  ?   Effort: Pulmonary effort is normal. No respiratory distress.  ?   Breath sounds: Normal breath sounds. No wheezing or rales.  ?Musculoskeletal:  ?   Cervical back: Normal range of motion and neck supple.  ?   Right lower leg: No edema.  ?   Left lower leg: No edema.  ?Lymphadenopathy:  ?   Cervical: No cervical adenopathy.  ?Skin: ?   General: Skin is warm and dry.  ?Neurological:  ?   General: No focal deficit present.  ?   Mental Status: She is alert and  oriented to person, place, and time.  ?Psychiatric:     ?   Mood and Affect: Mood normal.     ?   Behavior: Behavior normal.     ?   Thought Content: Thought content normal.  ? ? ? ? ? ?   ?Assessment & Plan:  ? ?Nocturnal cough- new.  Likely a combo of PND and GERD.  Start daily antihistamine in addition to Singulair and Flonase.  Add low dose Omeprazole '20mg'$  daily.  Pt expressed understanding and is in agreement w/ plan.  ?

## 2021-07-26 NOTE — Assessment & Plan Note (Signed)
New.  Pt reports she has not had indigestion or GERD until recently.  She admits to increased PND which may be triggering her increased acid production.  She has frequent need to clear her throat, nocturnal cough, 'lump in my throat'.  Start low dose Omeprazole ('20mg'$  daily) and monitor for improvement. ?

## 2021-07-26 NOTE — Assessment & Plan Note (Signed)
Deteriorated.  Pt has gained 5 lbs since last visit.  Encouraged healthy diet and regular exercise.  Will follow. ?

## 2021-07-26 NOTE — Patient Instructions (Signed)
Schedule your complete physical for January (see me sooner if needed) ?No need for labs today- yay!!! ?CONTINUE the HCTZ daily ?START the Omeprazole once daily to help w/ reflux and indigestion ?IF symptoms don't improve in the next 1-2 weeks- let me know! ?ADD the Cetirizine (Zyrtec) daily for the allergy component ?Call with any questions or concerns ?Stay Safe!  Stay Healthy! ?Happy Spring!!! ?

## 2021-07-26 NOTE — Assessment & Plan Note (Signed)
Relatively new dx for pt.  Currently adequate control on HCTZ 12.'5mg'$  daily.  Asymptomatic at this time.  No changes but will follow. ?

## 2021-08-05 ENCOUNTER — Other Ambulatory Visit: Payer: Self-pay | Admitting: Family Medicine

## 2021-08-21 ENCOUNTER — Encounter: Payer: Self-pay | Admitting: Family Medicine

## 2021-08-24 ENCOUNTER — Other Ambulatory Visit: Payer: Self-pay | Admitting: Family Medicine

## 2021-08-27 ENCOUNTER — Encounter: Payer: Self-pay | Admitting: Family Medicine

## 2021-08-27 ENCOUNTER — Telehealth (INDEPENDENT_AMBULATORY_CARE_PROVIDER_SITE_OTHER): Payer: BC Managed Care – PPO | Admitting: Family Medicine

## 2021-08-27 DIAGNOSIS — R058 Other specified cough: Secondary | ICD-10-CM | POA: Diagnosis not present

## 2021-08-27 MED ORDER — OMEPRAZOLE 40 MG PO CPDR: 40.0000 mg | DELAYED_RELEASE_CAPSULE | Freq: Every day | ORAL | 3 refills | Status: DC

## 2021-08-27 NOTE — Progress Notes (Unsigned)
Virtual Visit via Video   I connected with patient on 08/27/21 at 11:20 AM EDT by a video enabled telemedicine application and verified that I am speaking with the correct person using two identifiers.  Location patient: Home Location provider: [facility] Office Persons participating in the virtual visit: Patient, Provider, Flow[place] Noah Delaine H)  I discussed the limitations of evaluation and management by telemedicine and the availability of in person appointments. The patient expressed understanding and agreed to proceed.  Subjective:   HPI:   Cough- at last visit (1 month ago) was started on Omeprazole '20mg'$  daily and daily antihistamine in addition to Singulair for what was presumed to be a combination of PND and GERD.  Pt reports 'it is so much worse at night' and also w/ prolonged talking like in a meeting.  Cough continues to be dry.  Pt reports PPI worked for the first week but then it was no longer working.    ROS:   See pertinent positives and negatives per HPI.  Patient Active Problem List   Diagnosis Date Noted   GERD (gastroesophageal reflux disease) 07/26/2021   HTN (hypertension) 04/11/2021   Vitamin D deficiency 11/18/2016   Obesity (BMI 30.0-34.9) 11/18/2016   Anxiety 11/18/2016   Patellofemoral arthralgia of left knee 05/27/2013   Numerous moles 05/27/2013   Insomnia 12/04/2011   Physical exam, annual 10/22/2011    Social History   Tobacco Use   Smoking status: Never   Smokeless tobacco: Never  Substance Use Topics   Alcohol use: Yes    Comment: ocassionally    Current Outpatient Medications:    albuterol (VENTOLIN HFA) 108 (90 Base) MCG/ACT inhaler, Inhale 2 puffs into the lungs every 6 (six) hours as needed for wheezing or shortness of breath., Disp: 8 g, Rfl: 0   cetirizine (ZYRTEC) 10 MG tablet, Take 1 tablet (10 mg total) by mouth daily., Disp: 90 tablet, Rfl: 1   fluticasone (FLONASE) 50 MCG/ACT nasal spray, Place 2 sprays into both  nostrils daily., Disp: 16 g, Rfl: 6   hydrochlorothiazide (HYDRODIURIL) 12.5 MG tablet, Take 1 tablet by mouth once daily, Disp: 30 tablet, Rfl: 0   levonorgestrel-ethinyl estradiol (SEASONALE,INTROVALE,JOLESSA) 0.15-0.03 MG tablet, Take 1 tablet by mouth daily., Disp: 1 Package, Rfl: 11   montelukast (SINGULAIR) 10 MG tablet, TAKE 1 TABLET BY MOUTH ONCE DAILY AT BEDTIME . APPOINTMENT REQUIRED FOR FUTURE REFILLS, Disp: 90 tablet, Rfl: 0   omeprazole (PRILOSEC) 20 MG capsule, Take 1 capsule (20 mg total) by mouth daily., Disp: 30 capsule, Rfl: 3   sertraline (ZOLOFT) 50 MG tablet, Take 1 tablet by mouth once daily, Disp: 90 tablet, Rfl: 0   valACYclovir (VALTREX) 500 MG tablet, Take by mouth., Disp: , Rfl:    zolpidem (AMBIEN) 10 MG tablet, TAKE 1 TABLET BY MOUTH AT BEDTIME AS NEEDED FOR SLEEP, Disp: 30 tablet, Rfl: 3   Vitamin D, Ergocalciferol, (DRISDOL) 1.25 MG (50000 UNIT) CAPS capsule, Take 1 capsule (50,000 Units total) by mouth every 7 (seven) days. (Patient not taking: Reported on 08/27/2021), Disp: 12 capsule, Rfl: 0  No Known Allergies  Objective:   There were no vitals taken for this visit. AAOx3, NAD NCAT, EOMI No obvious CN deficits Coloring WNL Pt is able to speak clearly, coherently without shortness of breath or increased work of breathing. No cough heard Thought process is linear.  Mood is appropriate.   Assessment and Plan:   Nocturnal cough- suspect this is a combination of GERD and bronchospasm.  Pt reports sxs initially improved w/ addition of PPI but then returned 'with a vengeance'.  Will increase PPI to prescription strength ('40mg'$  ) and encouraged her to use albuterol either prior to talking in a meeting or when she starts coughing.  Pt expressed understanding and is in agreement w/ plan.    Annye Asa, MD 08/27/2021

## 2021-08-30 ENCOUNTER — Encounter: Payer: Self-pay | Admitting: Family Medicine

## 2021-09-18 ENCOUNTER — Other Ambulatory Visit: Payer: Self-pay | Admitting: Family Medicine

## 2021-09-26 ENCOUNTER — Other Ambulatory Visit: Payer: Self-pay | Admitting: Family Medicine

## 2021-10-06 ENCOUNTER — Other Ambulatory Visit: Payer: Self-pay | Admitting: Family Medicine

## 2021-10-14 DIAGNOSIS — K219 Gastro-esophageal reflux disease without esophagitis: Secondary | ICD-10-CM | POA: Diagnosis not present

## 2021-10-14 DIAGNOSIS — Z6833 Body mass index (BMI) 33.0-33.9, adult: Secondary | ICD-10-CM | POA: Diagnosis not present

## 2021-10-14 DIAGNOSIS — N951 Menopausal and female climacteric states: Secondary | ICD-10-CM | POA: Diagnosis not present

## 2021-10-14 DIAGNOSIS — D649 Anemia, unspecified: Secondary | ICD-10-CM | POA: Diagnosis not present

## 2021-10-14 DIAGNOSIS — R635 Abnormal weight gain: Secondary | ICD-10-CM | POA: Diagnosis not present

## 2021-10-14 DIAGNOSIS — E559 Vitamin D deficiency, unspecified: Secondary | ICD-10-CM | POA: Diagnosis not present

## 2021-10-14 DIAGNOSIS — R7303 Prediabetes: Secondary | ICD-10-CM | POA: Diagnosis not present

## 2021-10-21 DIAGNOSIS — Z1331 Encounter for screening for depression: Secondary | ICD-10-CM | POA: Diagnosis not present

## 2021-10-21 DIAGNOSIS — R635 Abnormal weight gain: Secondary | ICD-10-CM | POA: Diagnosis not present

## 2021-10-21 DIAGNOSIS — E559 Vitamin D deficiency, unspecified: Secondary | ICD-10-CM | POA: Diagnosis not present

## 2021-10-21 DIAGNOSIS — Z1339 Encounter for screening examination for other mental health and behavioral disorders: Secondary | ICD-10-CM | POA: Diagnosis not present

## 2021-10-21 DIAGNOSIS — D649 Anemia, unspecified: Secondary | ICD-10-CM | POA: Diagnosis not present

## 2021-10-21 DIAGNOSIS — R7303 Prediabetes: Secondary | ICD-10-CM | POA: Diagnosis not present

## 2021-10-27 ENCOUNTER — Other Ambulatory Visit: Payer: Self-pay | Admitting: Family Medicine

## 2021-11-01 DIAGNOSIS — Z6834 Body mass index (BMI) 34.0-34.9, adult: Secondary | ICD-10-CM | POA: Diagnosis not present

## 2021-11-01 DIAGNOSIS — R7303 Prediabetes: Secondary | ICD-10-CM | POA: Diagnosis not present

## 2021-11-15 DIAGNOSIS — Z6833 Body mass index (BMI) 33.0-33.9, adult: Secondary | ICD-10-CM | POA: Diagnosis not present

## 2021-11-15 DIAGNOSIS — E559 Vitamin D deficiency, unspecified: Secondary | ICD-10-CM | POA: Diagnosis not present

## 2021-11-21 DIAGNOSIS — R7303 Prediabetes: Secondary | ICD-10-CM | POA: Diagnosis not present

## 2021-11-21 DIAGNOSIS — Z6832 Body mass index (BMI) 32.0-32.9, adult: Secondary | ICD-10-CM | POA: Diagnosis not present

## 2021-11-22 ENCOUNTER — Other Ambulatory Visit: Payer: Self-pay | Admitting: Family Medicine

## 2021-12-04 ENCOUNTER — Other Ambulatory Visit: Payer: Self-pay | Admitting: Family Medicine

## 2021-12-04 NOTE — Telephone Encounter (Signed)
Patient is requesting a refill of the following medications: Requested Prescriptions   Pending Prescriptions Disp Refills   zolpidem (AMBIEN) 10 MG tablet [Pharmacy Med Name: Zolpidem Tartrate 10 MG Oral Tablet] 30 tablet 0    Sig: TAKE 1 TABLET BY MOUTH AT BEDTIME AS NEEDED FOR SLEEP    Date of patient request: 12/04/21 Last office visit: 07/26/21 Date of last refill: 08/05/21 Last refill amount: 30 Follow up time period per chart: 8 months

## 2021-12-13 ENCOUNTER — Encounter: Payer: Self-pay | Admitting: Family Medicine

## 2021-12-26 ENCOUNTER — Other Ambulatory Visit: Payer: Self-pay | Admitting: Family Medicine

## 2022-01-04 ENCOUNTER — Other Ambulatory Visit: Payer: Self-pay | Admitting: Family Medicine

## 2022-01-06 ENCOUNTER — Other Ambulatory Visit: Payer: Self-pay

## 2022-01-06 MED ORDER — OMEPRAZOLE 40 MG PO CPDR: 40.0000 mg | DELAYED_RELEASE_CAPSULE | Freq: Every day | ORAL | 3 refills | Status: DC

## 2022-01-06 NOTE — Telephone Encounter (Signed)
Zolipdem 10 mg LOV: 08/17/21 Last Refill:9//6/23 Upcoming appt:

## 2022-01-11 ENCOUNTER — Other Ambulatory Visit: Payer: Self-pay | Admitting: Family Medicine

## 2022-01-12 ENCOUNTER — Other Ambulatory Visit: Payer: Self-pay | Admitting: Family Medicine

## 2022-01-26 ENCOUNTER — Other Ambulatory Visit: Payer: Self-pay | Admitting: Family Medicine

## 2022-01-26 DIAGNOSIS — G4700 Insomnia, unspecified: Secondary | ICD-10-CM

## 2022-02-01 ENCOUNTER — Other Ambulatory Visit: Payer: Self-pay | Admitting: Family Medicine

## 2022-02-03 ENCOUNTER — Other Ambulatory Visit: Payer: Self-pay | Admitting: Family Medicine

## 2022-02-03 MED ORDER — ZOLPIDEM TARTRATE 10 MG PO TABS: 10.0000 mg | ORAL_TABLET | Freq: Every evening | ORAL | 1 refills | Status: DC | PRN

## 2022-02-03 NOTE — Telephone Encounter (Signed)
Zolpidem 10 mg LOV: 08/07/21 Last Refill:01/06/22 Upcoming appt: none

## 2022-03-16 ENCOUNTER — Other Ambulatory Visit: Payer: Self-pay | Admitting: Family Medicine

## 2022-04-14 ENCOUNTER — Ambulatory Visit (INDEPENDENT_AMBULATORY_CARE_PROVIDER_SITE_OTHER): Payer: 59 | Admitting: Family Medicine

## 2022-04-14 ENCOUNTER — Encounter: Payer: Self-pay | Admitting: Family Medicine

## 2022-04-14 VITALS — BP 128/80 | HR 78 | Temp 97.7°F | Resp 17 | Ht 64.0 in | Wt 203.0 lb

## 2022-04-14 DIAGNOSIS — E559 Vitamin D deficiency, unspecified: Secondary | ICD-10-CM | POA: Diagnosis not present

## 2022-04-14 DIAGNOSIS — Z0000 Encounter for general adult medical examination without abnormal findings: Secondary | ICD-10-CM

## 2022-04-14 DIAGNOSIS — E669 Obesity, unspecified: Secondary | ICD-10-CM | POA: Diagnosis not present

## 2022-04-14 DIAGNOSIS — Z23 Encounter for immunization: Secondary | ICD-10-CM | POA: Diagnosis not present

## 2022-04-14 DIAGNOSIS — R0981 Nasal congestion: Secondary | ICD-10-CM

## 2022-04-14 LAB — CBC WITH DIFFERENTIAL/PLATELET
Basophils Absolute: 0.1 10*3/uL (ref 0.0–0.1)
Basophils Relative: 0.7 % (ref 0.0–3.0)
Eosinophils Absolute: 0.6 10*3/uL (ref 0.0–0.7)
Eosinophils Relative: 6.3 % — ABNORMAL HIGH (ref 0.0–5.0)
HCT: 33.9 % — ABNORMAL LOW (ref 36.0–46.0)
Hemoglobin: 10.9 g/dL — ABNORMAL LOW (ref 12.0–15.0)
Lymphocytes Relative: 36.1 % (ref 12.0–46.0)
Lymphs Abs: 3.2 10*3/uL (ref 0.7–4.0)
MCHC: 32.3 g/dL (ref 30.0–36.0)
MCV: 82.4 fl (ref 78.0–100.0)
Monocytes Absolute: 0.4 10*3/uL (ref 0.1–1.0)
Monocytes Relative: 4.1 % (ref 3.0–12.0)
Neutro Abs: 4.6 10*3/uL (ref 1.4–7.7)
Neutrophils Relative %: 52.8 % (ref 43.0–77.0)
Platelets: 462 10*3/uL — ABNORMAL HIGH (ref 150.0–400.0)
RBC: 4.11 Mil/uL (ref 3.87–5.11)
RDW: 15.2 % (ref 11.5–15.5)
WBC: 8.8 10*3/uL (ref 4.0–10.5)

## 2022-04-14 LAB — HEPATIC FUNCTION PANEL
ALT: 9 U/L (ref 0–35)
AST: 15 U/L (ref 0–37)
Albumin: 3.8 g/dL (ref 3.5–5.2)
Alkaline Phosphatase: 81 U/L (ref 39–117)
Bilirubin, Direct: 0.1 mg/dL (ref 0.0–0.3)
Total Bilirubin: 0.3 mg/dL (ref 0.2–1.2)
Total Protein: 7.6 g/dL (ref 6.0–8.3)

## 2022-04-14 LAB — BASIC METABOLIC PANEL
BUN: 9 mg/dL (ref 6–23)
CO2: 28 mEq/L (ref 19–32)
Calcium: 9 mg/dL (ref 8.4–10.5)
Chloride: 102 mEq/L (ref 96–112)
Creatinine, Ser: 0.84 mg/dL (ref 0.40–1.20)
GFR: 87.14 mL/min (ref >60.00)
Glucose, Bld: 99 mg/dL (ref 70–99)
Potassium: 3.7 mEq/L (ref 3.5–5.1)
Sodium: 137 mEq/L (ref 135–145)

## 2022-04-14 LAB — LIPID PANEL
Cholesterol: 177 mg/dL (ref 0–200)
HDL: 54.8 mg/dL (ref >39.00)
LDL Cholesterol: 108 mg/dL — ABNORMAL HIGH (ref 0–99)
NonHDL: 122.07
Total CHOL/HDL Ratio: 3
Triglycerides: 72 mg/dL (ref 0.0–149.0)
VLDL: 14.4 mg/dL (ref 0.0–40.0)

## 2022-04-14 LAB — VITAMIN D 25 HYDROXY (VIT D DEFICIENCY, FRACTURES): VITD: 25.14 ng/mL — ABNORMAL LOW (ref 30.00–100.00)

## 2022-04-14 LAB — TSH: TSH: 1 u[IU]/mL (ref 0.35–5.50)

## 2022-04-14 NOTE — Assessment & Plan Note (Signed)
Check labs and replete prn. 

## 2022-04-14 NOTE — Patient Instructions (Signed)
Follow up in 1 year or as needed We'll notify you of your lab results and make any changes if needed Continue to work on healthy diet and regular exercise- you can do it!! Call with any questions or concerns Stay Safe!  Stay Healthy! Happy New Year!  Happy [place] Birthday!!

## 2022-04-14 NOTE — Progress Notes (Signed)
   Subjective:    Patient ID: Tracy Burch, female    DOB: [birth year], 42 y.o.   MRN: [id number]  HPI CPE- UTD on pap.  Due to start mammograms- has GYN next month.  UTD on Tdap.  Will get flu today.  Patient Care Team    Relationship Specialty Notifications Start End  Midge Minium, MD PCP - General Family Medicine  10/22/11     Health Maintenance  Topic Date Due   INFLUENZA VACCINE  10/29/2021   PAP SMEAR-Modifier  05/02/2022   DTaP/Tdap/Td (2 - Td or Tdap) 10/27/2022   HPV VACCINES  Aged Out   COVID-19 Vaccine  Discontinued   Hepatitis C Screening  Discontinued   HIV Screening  Discontinued      Review of Systems Patient reports no vision/ hearing changes, adenopathy,fever,  persistant/recurrent hoarseness , swallowing issues, chest pain, palpitations, edema, persistant/recurrent cough, hemoptysis, dyspnea (rest/exertional/paroxysmal nocturnal), gastrointestinal bleeding (melena, rectal bleeding), abdominal pain, significant heartburn, bowel changes, GU symptoms (dysuria, hematuria, incontinence), Gyn symptoms (abnormal  bleeding, pain),  syncope, focal weakness, memory loss, numbness & tingling, skin/hair/nail changes, abnormal bruising or bleeding, anxiety, or depression.   + 10 lb weight gain + daily AM congestion despite Zyrtec, Singulair, and Flonase    Objective:   Physical Exam General Appearance:    Alert, cooperative, no distress, appears stated age  Head:    Normocephalic, without obvious abnormality, atraumatic  Eyes:    PERRL, conjunctiva/corneas clear, EOM's intact both eyes  Ears:    Normal TM's and external ear canals, both ears  Nose:   Nares normal, septum midline, mucosa normal, no drainage    or sinus tenderness  Throat:   Lips, mucosa, and tongue normal; teeth and gums normal  Neck:   Supple, symmetrical, trachea midline, no adenopathy;    Thyroid: no enlargement/tenderness/nodules  Back:     Symmetric, no curvature, ROM normal, no CVA  tenderness  Lungs:     Clear to auscultation bilaterally, respirations unlabored  Chest Wall:    No tenderness or deformity   Heart:    Regular rate and rhythm, S1 and S2 normal, no murmur, rub   or gallop  Breast Exam:    Deferred to GYN  Abdomen:     Soft, non-tender, bowel sounds active all four quadrants,    no masses, no organomegaly  Genitalia:    Deferred to GYN  Rectal:    Extremities:   Extremities normal, atraumatic, no cyanosis or edema  Pulses:   2+ and symmetric all extremities  Skin:   Skin color, texture, turgor normal, no rashes or lesions  Lymph nodes:   Cervical, supraclavicular, and axillary nodes normal  Neurologic:   CNII-XII intact, normal strength, sensation and reflexes    throughout          Assessment & Plan:

## 2022-04-14 NOTE — Assessment & Plan Note (Signed)
Pt's PE WNL w/ exception of BMI.  Flu shot given.  UTD on Tdap, pap.  Has GYN appt next month for mammo.  Check labs.  Anticipatory guidance provided.

## 2022-04-14 NOTE — Assessment & Plan Note (Signed)
Deteriorated.  Pt has gained 10 lbs over the holidays.  Went on a cruise for her birthday.  Encouraged low carb/low sugar diet and regular exercise.  Check labs to risk stratify.  Will follow.

## 2022-04-15 ENCOUNTER — Telehealth: Payer: Self-pay

## 2022-04-15 ENCOUNTER — Other Ambulatory Visit: Payer: Self-pay

## 2022-04-15 ENCOUNTER — Other Ambulatory Visit: Payer: Self-pay | Admitting: Family Medicine

## 2022-04-15 DIAGNOSIS — E559 Vitamin D deficiency, unspecified: Secondary | ICD-10-CM

## 2022-04-15 MED ORDER — VITAMIN D (ERGOCALCIFEROL) 1.25 MG (50000 UNIT) PO CAPS: 50000.0000 [IU] | ORAL_CAPSULE | ORAL | 12 refills | Status: DC

## 2022-04-15 NOTE — Telephone Encounter (Signed)
Informed pt of lab results and sent in Vit D 50,000 units to pharmacy

## 2022-04-15 NOTE — Telephone Encounter (Signed)
-----  Message from Midge Minium, MD sent at 04/15/2022  7:40 AM EST ----- Vit D is low.  Based on this, we need to start 50,000 units weekly x12 weeks in addition to daily OTC supplement of at least 2000 units.   Hemoglobin (blood count) remains mildly low but is stable.  This is common in menstruating women and no cause for concern.  Remainder of labs look great!  No changes

## 2022-04-18 ENCOUNTER — Encounter: Payer: Self-pay | Admitting: Family Medicine

## 2022-04-22 ENCOUNTER — Telehealth: Payer: Self-pay

## 2022-04-22 NOTE — Telephone Encounter (Signed)
Pt states her insurance is asking for a PA on the RX Ambien 10 mg Can we obtain a PA thanks in advance

## 2022-04-25 ENCOUNTER — Other Ambulatory Visit (HOSPITAL_COMMUNITY): Payer: Self-pay

## 2022-04-28 ENCOUNTER — Other Ambulatory Visit: Payer: Self-pay | Admitting: Family Medicine

## 2022-04-29 ENCOUNTER — Telehealth: Payer: Self-pay

## 2022-04-29 ENCOUNTER — Other Ambulatory Visit (HOSPITAL_COMMUNITY): Payer: Self-pay

## 2022-04-29 NOTE — Telephone Encounter (Signed)
PA pending. Created telephone encounter for PA. Key: [id number]

## 2022-04-29 NOTE — Telephone Encounter (Signed)
Pharmacy Patient Advocate Encounter   Received notification that prior authorization for Zolpidem Tartrate '10MG'$  tablets is required/requested.  Per Test Claim: Plan limits exceeded, Max 0.5 tablet    PA submitted on 04/29/22 to (ins) [employer] via CoverMyMeds Key [id number] Status is pending

## 2022-05-02 NOTE — Telephone Encounter (Signed)
Received fax for additional information. Completed and faxed back to [phone]

## 2022-05-02 NOTE — Telephone Encounter (Signed)
Pharmacy Patient Advocate Encounter  Prior Authorization for Zolpidem has been approved.   Effective dates: 05/02/2022 through 05/03/2023

## 2022-05-02 NOTE — Telephone Encounter (Signed)
Called patient and made her aware.

## 2022-05-03 ENCOUNTER — Other Ambulatory Visit (HOSPITAL_COMMUNITY): Payer: Self-pay

## 2022-05-16 ENCOUNTER — Other Ambulatory Visit: Payer: Self-pay

## 2022-05-16 ENCOUNTER — Encounter: Payer: Self-pay | Admitting: Allergy

## 2022-05-16 ENCOUNTER — Ambulatory Visit: Payer: 59 | Admitting: Allergy

## 2022-05-16 VITALS — BP 118/66 | HR 69 | Temp 98.1°F | Resp 17 | Ht 67.0 in | Wt 203.2 lb

## 2022-05-16 DIAGNOSIS — H1013 Acute atopic conjunctivitis, bilateral: Secondary | ICD-10-CM | POA: Diagnosis not present

## 2022-05-16 DIAGNOSIS — J3089 Other allergic rhinitis: Secondary | ICD-10-CM

## 2022-05-16 DIAGNOSIS — J4530 Mild persistent asthma, uncomplicated: Secondary | ICD-10-CM

## 2022-05-16 MED ORDER — RYALTRIS 665-25 MCG/ACT NA SUSP: 2.0000 | Freq: Every day | NASAL | 5 refills | Status: DC

## 2022-05-16 MED ORDER — PULMICORT FLEXHALER 90 MCG/ACT IN AEPB: 2.0000 | INHALATION_SPRAY | Freq: Two times a day (BID) | RESPIRATORY_TRACT | 5 refills | Status: DC

## 2022-05-16 NOTE — Progress Notes (Signed)
New Patient Note  RE: Alessa Jakel MRN: [id number] DOB: 1980-04-22 Date of Office Visit: 05/16/2022  Primary care provider: Midge Minium, MD  Chief Complaint: allergies  History of present illness: Tracy Burch is a 42 y.o. female presenting today for evaluation of allergic rhinitis and coughing.   She states she has issues with her nose and her breathing.  She states the right nostril seems to stay congested.  She does report sinus pressure in forehead.  She reports nasal drainage and can feel it in the throat.  She does report itchy eyes all the time, watery eyes.  She states as we get in to March and April symptoms tend to worsen.  She has not used eye drops.  She uses flonase daily and it helps sometimes.   She takes zyrtec in the AM (for past 1.5 years) and montelukast in PM (for years).  She does not know if these medication are helpful.   She can have coughing bouts.  She has used Scientist, forensic but she was still coughing thus not helpful.  She has an albuterol for as needed.  She states it is quite intermittent when she uses albuterol.  She did use it this week once for a coughing bout.  She reports wheezing often.  She reports having nighttime awakenings for the coughing.  She has not been on a maintenance inhaler before.    No history of eczema or food allergy.   Review of systems: Review of Systems  Constitutional: Negative.   HENT:  Positive for congestion and postnasal drip.   Eyes: Negative.   Respiratory:  Positive for cough and wheezing.   Cardiovascular: Negative.   Gastrointestinal: Negative.   Musculoskeletal: Negative.   Skin: Negative.   Allergic/Immunologic: Negative.   Neurological: Negative.     All other systems negative unless noted above in HPI  Past medical history: Past Medical History:  Diagnosis Date   Allergy    Anemia    Chicken pox    Chlamydia    remote h/o   UTI (urinary tract infection)     Past surgical  history: Past Surgical History:  Procedure Laterality Date   WISDOM TOOTH EXTRACTION      Family history:  History reviewed. No pertinent family history.  Social history: Lives in a home with carpeting with electric heating and central cooling.  No pets in the home.  No concern for water damage, mildew or roaches in the home.  She is a Lobbyist.  No smoking history   Medication List: Current Outpatient Medications  Medication Sig Dispense Refill   cetirizine (ZYRTEC) 10 MG tablet Take 1 tablet by mouth once daily 90 tablet 0   fluticasone (FLONASE) 50 MCG/ACT nasal spray Place 2 sprays into both nostrils daily. 16 g 6   hydrochlorothiazide (HYDRODIURIL) 12.5 MG tablet Take 1 tablet by mouth once daily 30 tablet 0   levonorgestrel-ethinyl estradiol (SEASONALE,INTROVALE,JOLESSA) 0.15-0.03 MG tablet Take 1 tablet by mouth daily. 1 Package 11   montelukast (SINGULAIR) 10 MG tablet TAKE 1 TABLET BY MOUTH EVERY DAY AT BEDTIME 90 tablet 0   omeprazole (PRILOSEC) 40 MG capsule Take 1 capsule (40 mg total) by mouth daily. 30 capsule 3   sertraline (ZOLOFT) 50 MG tablet Take 1 tablet by mouth once daily 90 tablet 0   valACYclovir (VALTREX) 500 MG tablet Take by mouth.     Vitamin D, Ergocalciferol, (DRISDOL) 1.25 MG (50000 UNIT) CAPS capsule Take 1 capsule (  50,000 Units total) by mouth every 7 (seven) days. 7 capsule 12   zolpidem (AMBIEN) 10 MG tablet TAKE 1 TABLET BY MOUTH AT BEDTIME AS NEEDED FOR SLEEP 30 tablet 3   VENTOLIN HFA 108 (90 Base) MCG/ACT inhaler INHALE 2 PUFFS BY MOUTH EVERY 6 HOURS AS NEEDED FOR WHEEZING FOR SHORTNESS OF BREATH (Patient not taking: Reported on 05/16/2022) 18 g 0   zolpidem (AMBIEN) 10 MG tablet Take 1 tablet (10 mg total) by mouth at bedtime as needed for sleep. 15 tablet 1   No current facility-administered medications for this visit.    Known medication allergies: No Known Allergies   Physical examination: Blood pressure 118/66, pulse 69,  temperature 98.1 F (36.7 C), temperature source Temporal, resp. rate 17, height 5' 7"$  (1.702 m), weight 203 lb 3.2 oz (92.2 kg).  General: Alert, interactive, in no acute distress. HEENT: PERRLA, TMs pearly gray, turbinates moderately edematous with clear discharge, post-pharynx non erythematous. Neck: Supple without lymphadenopathy. Lungs: Clear to auscultation without wheezing, rhonchi or rales. {no increased work of breathing. CV: Normal S1, S2 without murmurs. Abdomen: Nondistended, nontender. Skin: Warm and dry, without lesions or rashes. Extremities:  No clubbing, cyanosis or edema. Neuro:   Grossly intact.  Diagnositics/Labs:  Spirometry: FEV1: 2.7L 96%, FVC: 3.13L 92%, ratio consistent with nonobstructive pattern  Allergy testing:   Airborne Adult Perc - 05/16/22 1000     Time Antigen Placed 1016    Allergen Manufacturer Greer    Location Back    Number of Test 59    1. Control-Buffer 50% Glycerol Negative    2. Control-Histamine 1 mg/ml 2+    3. Albumin saline Negative    4. Oak Creek Negative    5. [place] 3+    6. Johnson 3+    7. Hazen Blue Negative    8. Meadow Fescue 3+    9. Perennial Rye 3+    12. Cocklebur Negative    13. Burweed Marshelder Negative    14. Ragweed, short Negative    15. Ragweed, Giant Negative    16. Plantain,  English Negative    17. Lamb's Quarters Negative    18. Sheep Sorrell Negative    19. Rough Pigweed Negative    20. Marsh Elder, Rough Negative    21. Mugwort, Common Negative    22. Ash mix Negative    23. Birch mix Negative    24. Beech American Negative    25. Box, Elder Negative    26. Cedar, red Negative    27. Cottonwood, [place] Negative    28. Elm mix Negative    29. Hickory Negative    30. Maple mix Negative    31. Oak, [place] mix Negative    32. Pecan Pollen Negative    33. Pine mix Negative    34. Sycamore Eastern Negative    35. Harmony, Black Pollen Negative    36. Alternaria alternata Negative    37.  Cladosporium Herbarum Negative    38. Aspergillus mix Negative    39. Penicillium mix Negative    40. Bipolaris sorokiniana (Helminthosporium) Negative    41. Drechslera spicifera (Curvularia) Negative    42. Mucor plumbeus Negative    43. Fusarium moniliforme 2+    44. Aureobasidium pullulans (pullulara) Negative    45. Rhizopus oryzae Negative    46. Botrytis cinera Negative    47. Epicoccum nigrum Negative    48. Phoma betae Negative    49. Candida Albicans Negative  50. Trichophyton mentagrophytes Negative    51. Mite, D Farinae  5,000 AU/ml Negative    52. Mite, D Pteronyssinus  5,000 AU/ml Negative    53. Cat Hair 10,000 BAU/ml Negative    54.  Dog Epithelia Negative    55. Mixed Feathers Negative    56. Horse Epithelia Negative    57. Cockroach, German Negative    58. Mouse Negative    59. Tobacco Leaf Negative             Intradermal - 05/16/22 1000     Time Antigen Placed 1046    Allergen Manufacturer Lavella Hammock    Location Arm    Number of Test 11    Control Negative    Ragweed mix 2+    Weed mix 2+    Tree mix Negative    Mold 1 2+    Mold 2 Negative    Mold 3 Negative    Cat 2+    Dog 2+    Cockroach Negative    Mite mix Negative             Allergy testing results were read and interpreted by provider, documented by clinical staff.   Assessment and plan:   Allergic rhinitis with conjunctivitis - Testing today showed: grasses, ragweed, weeds, outdoor molds, cat, and dog. - Copy of test results provided.  - Avoidance measures provided. - Stop taking: Montelukast as you may not be a responder to this medication.   Flonase for now while trying new nasal spray. Zyrtec as seems ineffective at this time.  - Start taking: Xyzal (levocetirizine) 75m tablet once daily.  This replaces Zyrtec.   Ryaltris (olopatadine/mometasone) two sprays per nostril 2 times daily as needed for nasal congestion or drainage.  Sample provided.   Pataday (olopatadine) one  drop per eye daily as needed for itchy/watery eyes.   - You can use an extra dose of the antihistamine, if needed, for breakthrough symptoms.  - Consider nasal saline rinses 1-2 times daily to remove allergens from the nasal cavities as well as help with mucous clearance (this is especially helpful to do before the nasal sprays are given) - Consider allergy shots as a means of long-term control if medication management is not effective enough. - Allergy shots "re-train" and "reset" the immune system to ignore environmental allergens and decrease the resulting immune response to those allergens (sneezing, itchy watery eyes, runny nose, nasal congestion, etc).    - Allergy shots improve symptoms in 75-85% of patients.   Reactive airway - Daily controller medication(s): Pulmicort Flexhaler 950m 2 puffs twice daily - Prior to physical activity: albuterol 2 puffs 10-15 minutes before physical activity. - Rescue medications: albuterol 2-4 puffs every 4-6 hours as needed  - Asthma control goals:  * Full participation in all desired activities (may need albuterol before activity) * Albuterol use two time or less a week on average (not counting use with activity) * Cough interfering with sleep two time or less a month * Oral steroids no more than once a year * No hospitalizations  Follow-up in 2-3 months or sooner if needed  I appreciate the opportunity to take part in Latrisha's care. Please do not hesitate to contact me with questions.  Sincerely,   ShPrudy FeelerMD Allergy/Immunology [facility]

## 2022-05-16 NOTE — Patient Instructions (Signed)
Allergic rhinitis with conjunctivitis - Testing today showed: grasses, ragweed, weeds, outdoor molds, cat, and dog. - Copy of test results provided.  - Avoidance measures provided. - Stop taking: Montelukast as you may not be a responder to this medication.   Flonase for now while trying new nasal spray. Zyrtec as seems ineffective at this time.  - Start taking: Xyzal (levocetirizine) 56m tablet once daily.  This replaces Zyrtec.   Ryaltris (olopatadine/mometasone) two sprays per nostril 2 times daily as needed for nasal congestion or drainage.  Sample provided.   Pataday (olopatadine) one drop per eye daily as needed for itchy/watery eyes.   - You can use an extra dose of the antihistamine, if needed, for breakthrough symptoms.  - Consider nasal saline rinses 1-2 times daily to remove allergens from the nasal cavities as well as help with mucous clearance (this is especially helpful to do before the nasal sprays are given) - Consider allergy shots as a means of long-term control if medication management is not effective enough. - Allergy shots "re-train" and "reset" the immune system to ignore environmental allergens and decrease the resulting immune response to those allergens (sneezing, itchy watery eyes, runny nose, nasal congestion, etc).    - Allergy shots improve symptoms in 75-85% of patients.   Reactive airway - Daily controller medication(s): Pulmicort Flexhaler 946m 2 puffs twice daily - Prior to physical activity: albuterol 2 puffs 10-15 minutes before physical activity. - Rescue medications: albuterol 2-4 puffs every 4-6 hours as needed  - Asthma control goals:  * Full participation in all desired activities (may need albuterol before activity) * Albuterol use two time or less a week on average (not counting use with activity) * Cough interfering with sleep two time or less a month * Oral steroids no more than once a year * No hospitalizations  Follow-up in 2-3 months or  sooner if needed

## 2022-05-19 ENCOUNTER — Other Ambulatory Visit: Payer: Self-pay | Admitting: Family Medicine

## 2022-05-19 LAB — RESULTS CONSOLE HPV: CHL HPV: NEGATIVE

## 2022-05-20 LAB — HM PAP SMEAR
Chlamydia, Swab/Urine, PCR: NEGATIVE
HPV, high-risk: NEGATIVE

## 2022-05-27 ENCOUNTER — Other Ambulatory Visit: Payer: Self-pay | Admitting: *Deleted

## 2022-05-27 ENCOUNTER — Encounter: Payer: Self-pay | Admitting: Allergy

## 2022-05-27 MED ORDER — LEVOCETIRIZINE DIHYDROCHLORIDE 5 MG PO TABS: 5.0000 mg | ORAL_TABLET | Freq: Every evening | ORAL | 5 refills | Status: DC

## 2022-06-01 ENCOUNTER — Other Ambulatory Visit: Payer: Self-pay | Admitting: Family Medicine

## 2022-06-02 ENCOUNTER — Other Ambulatory Visit (HOSPITAL_COMMUNITY): Payer: Self-pay

## 2022-06-02 ENCOUNTER — Telehealth: Payer: Self-pay

## 2022-06-02 ENCOUNTER — Other Ambulatory Visit: Payer: Self-pay | Admitting: Family Medicine

## 2022-06-02 DIAGNOSIS — G4700 Insomnia, unspecified: Secondary | ICD-10-CM

## 2022-06-02 NOTE — Telephone Encounter (Signed)
Patient Advocate Encounter   Received notification from [place] that prior authorization is required for Levocetirizine Dihydrochloride '5MG'$  tablets  Submitted: 06-02-2022 Key [id number]   Status is pending

## 2022-06-03 NOTE — Telephone Encounter (Signed)
Ambien 10 mg LOV: 04/14/22 Last Refill:01/06/22 Upcoming appt: 04/15/23

## 2022-06-03 NOTE — Telephone Encounter (Signed)
Pt aware that the Rx has been sent in

## 2022-06-03 NOTE — Telephone Encounter (Signed)
Unfortunately the Xyzal prior authorization was denied due to it being a product exclusion from insurance. Would you like for the patient to purchase over the counter?

## 2022-06-03 NOTE — Telephone Encounter (Signed)
Patient Advocate Encounter  Received a fax from [facility] regarding Prior Authorization for Levocetirizine Dihydrochloride '5MG'$  tablets.   Authorization has been DENIED due to

## 2022-06-04 NOTE — Telephone Encounter (Signed)
Per provider: Yes if she can purchase OTC she can do so.      Called patient - DOB verified - advised of provider notation above.  Patient verbalized understanding, no further questions.

## 2022-06-04 NOTE — Telephone Encounter (Signed)
Attempted to call patient, there was no answer and mailbox was full. Will need to attempt to call patient again.

## 2022-06-06 LAB — HM MAMMOGRAPHY

## 2022-06-19 ENCOUNTER — Other Ambulatory Visit: Payer: Self-pay | Admitting: Family Medicine

## 2022-06-27 ENCOUNTER — Other Ambulatory Visit: Payer: Self-pay | Admitting: Family Medicine

## 2022-07-23 ENCOUNTER — Other Ambulatory Visit: Payer: Self-pay | Admitting: Family Medicine

## 2022-08-05 ENCOUNTER — Other Ambulatory Visit: Payer: Self-pay | Admitting: Family Medicine

## 2022-08-07 ENCOUNTER — Other Ambulatory Visit: Payer: Self-pay | Admitting: Family Medicine

## 2022-08-14 ENCOUNTER — Ambulatory Visit: Payer: 59 | Admitting: Allergy

## 2022-08-14 ENCOUNTER — Encounter: Payer: Self-pay | Admitting: Allergy

## 2022-08-14 ENCOUNTER — Other Ambulatory Visit: Payer: Self-pay

## 2022-08-14 VITALS — BP 120/80 | HR 66 | Temp 97.0°F

## 2022-08-14 DIAGNOSIS — J3089 Other allergic rhinitis: Secondary | ICD-10-CM

## 2022-08-14 DIAGNOSIS — J4530 Mild persistent asthma, uncomplicated: Secondary | ICD-10-CM | POA: Diagnosis not present

## 2022-08-14 DIAGNOSIS — H1013 Acute atopic conjunctivitis, bilateral: Secondary | ICD-10-CM

## 2022-08-14 MED ORDER — RYALTRIS 665-25 MCG/ACT NA SUSP: 2.0000 | Freq: Two times a day (BID) | NASAL | 5 refills | Status: AC | PRN

## 2022-08-14 MED ORDER — PULMICORT FLEXHALER 90 MCG/ACT IN AEPB: 2.0000 | INHALATION_SPRAY | Freq: Two times a day (BID) | RESPIRATORY_TRACT | 5 refills | Status: DC

## 2022-08-14 NOTE — Patient Instructions (Addendum)
Allergic rhinitis with conjunctivitis - Continue avoidance measures for grasses, ragweed, weeds, outdoor molds, cat, and dog. - Continue:   Xyzal (levocetirizine) 5mg  tablet  OR Zyrtec 10mg  tablet daily. Singulair 10mg  tablet daily.  Ryaltris (olopatadine/mometasone) two sprays per nostril 2 times daily as needed for nasal congestion or drainage.  Sample provided.   Pataday (olopatadine) one drop per eye daily IF NEEDED for itchy/watery eyes.   - You can use an extra dose of the antihistamine, if needed, for breakthrough symptoms.  - Consider nasal saline rinses 1-2 times daily to remove allergens from the nasal cavities as well as help with mucous clearance (this is especially helpful to do before the nasal sprays are given) - Consider allergy shots as a means of long-term control if medication management is not effective enough. - Allergy shots "re-train" and "reset" the immune system to ignore environmental allergens and decrease the resulting immune response to those allergens (sneezing, itchy watery eyes, runny nose, nasal congestion, etc).    - Allergy shots improve symptoms in 75-85% of patients.   Reactive airway - Daily controller medication(s): Pulmicort Flexhaler 2 puffs twice daily - Prior to physical activity: albuterol 2 puffs 10-15 minutes before physical activity. - Rescue medications: albuterol 2-4 puffs every 4-6 hours as needed  - Asthma control goals:  * Full participation in all desired activities (may need albuterol before activity) * Albuterol use two time or less a week on average (not counting use with activity) * Cough interfering with sleep two time or less a month * Oral steroids no more than once a year * No hospitalizations  Follow-up in 6 months or sooner if needed

## 2022-08-14 NOTE — Progress Notes (Signed)
Follow-up Note  RE: Tracy Burch MRN: [id number] DOB: 05/02/1980 Date of Office Visit: 08/14/2022   History of present illness: Tracy Burch is a 42 y.o. female presenting today for follow-up of allergic rhinitis with conjunctivitis and reactive airway.  She was last seen in the office on 05/16/2022 by myself for her initial visit. She states she is doing much better than she was at her initial visit.  I did recommend she try Xyzal all to replace Zyrtec however she did not seem to notice a big difference between these 2 antihistamines.  I did give her a sample of Ryaltris to try for her nasal symptoms as she states it does work well.  However she did not realize that the text messages she was receiving was from the pharmacy that prescribes Ryaltris that she never approved the medication for delivery.  When she ran out of the sample she did not have any more to use.  She did resume singulair.  She did not notify my office that she was having more nasal congestion.  I have recommended that she stop the Singulair to see if it was effective for her.  She did notice increase in allergy symptoms with stopping and thus she did resume Singulair and noticed improvement in her symptoms.  She is a responder of Singulair.  She continues to use this daily.  She reports she does not like to put anything in her eye that she does not use the Pataday that often.  She states it is an absolute when she has used the Pataday for her eyes and it does help so she states she will use if she is absolutely miserable with her eye symptoms.  She also states she looked into starting allergy shots with her insurance and at the time the person she talked to stated that she would have to pay $500 every injection visit and this is of course not doable.  She does plan to call again and talk to a different person and see what she is advised. In regards to reactive airway she is using the Pulmicort Flexhaler 2 puffs daily.   She states it did take a while to get healing of using the inhaler.  Now that she is using it appropriately she does notice that it is helping with her symptom control.  She has definitely needed out less albuterol since she has been on Pulmicort.  She has not required any ED or urgent care visits or systemic steroid needs since the last visit.  Review of systems in the past 4 weeks: Review of Systems  Constitutional: Negative.   HENT:         See HPI  Eyes: Negative.   Respiratory: Negative.    Cardiovascular: Negative.   Gastrointestinal: Negative.   Musculoskeletal: Negative.   Skin: Negative.   Allergic/Immunologic: Negative.   Neurological: Negative.      All other systems negative unless noted above in HPI  Past medical/social/surgical/family history have been reviewed and are unchanged unless specifically indicated below.  No changes  Medication List: Current Outpatient Medications  Medication Sig Dispense Refill   cetirizine (ZYRTEC) 10 MG tablet Take 1 tablet by mouth once daily 90 tablet 0   fluticasone (FLONASE) 50 MCG/ACT nasal spray Place 2 sprays into both nostrils daily. 16 g 6   hydrochlorothiazide (HYDRODIURIL) 12.5 MG tablet Take 1 tablet by mouth once daily 30 tablet 0   levocetirizine (XYZAL) 5 MG tablet Take 1 tablet (5  mg total) by mouth every evening. 30 tablet 5   montelukast (SINGULAIR) 10 MG tablet TAKE 1 TABLET BY MOUTH ONCE DAILY AT BEDTIME 90 tablet 0   norethindrone (MICRONOR) 0.35 MG tablet Take 1 tablet by mouth daily.     Olopatadine-Mometasone (RYALTRIS) [id number] MCG/ACT SUSP Place 2 sprays into the nose daily. 29 g 5   Olopatadine-Mometasone (RYALTRIS) 665-25 MCG/ACT SUSP Place 2 sprays into the nose 2 (two) times daily as needed. 29 g 5   omeprazole (PRILOSEC) 40 MG capsule Take 1 capsule by mouth once daily 30 capsule 0   sertraline (ZOLOFT) 50 MG tablet Take 1 tablet by mouth once daily 90 tablet 0   valACYclovir (VALTREX) 500 MG tablet Take by  mouth.     VENTOLIN HFA 108 (90 Base) MCG/ACT inhaler INHALE 2 PUFFS BY MOUTH EVERY 6 HOURS AS NEEDED FOR WHEEZING FOR SHORTNESS OF BREATH 18 g 0   Vitamin D, Ergocalciferol, (DRISDOL) 1.25 MG (50000 UNIT) CAPS capsule Take 1 capsule (50,000 Units total) by mouth every 7 (seven) days. 7 capsule 12   zolpidem (AMBIEN) 10 MG tablet TAKE 1 TABLET BY MOUTH AT BEDTIME AS NEEDED FOR SLEEP 30 tablet 3   zolpidem (AMBIEN) 10 MG tablet TAKE 1 TABLET BY MOUTH AT BEDTIME AS NEEDED FOR SLEEP 30 tablet 3   Budesonide (PULMICORT FLEXHALER) 90 MCG/ACT inhaler Inhale 2 puffs into the lungs 2 (two) times daily. 1 each 5   No current facility-administered medications for this visit.     Known medication allergies: No Known Allergies   Physical examination: Blood pressure 120/80, pulse 66, temperature (!) 97 F (36.1 C), temperature source Temporal, SpO2 98 %.  General: Alert, interactive, in no acute distress. HEENT: PERRLA, TMs pearly gray, turbinates mildly edematous without discharge, post-pharynx non erythematous. Neck: Supple without lymphadenopathy. Lungs: Clear to auscultation without wheezing, rhonchi or rales. {no increased work of breathing. CV: Normal S1, S2 without murmurs. Abdomen: Nondistended, nontender. Skin: Warm and dry, without lesions or rashes. Extremities:  No clubbing, cyanosis or edema. Neuro:   Grossly intact.  Diagnositics/Labs: None today  Assessment and plan:   Allergic rhinitis with conjunctivitis - Continue avoidance measures for grasses, ragweed, weeds, outdoor molds, cat, and dog. - Continue:   Xyzal (levocetirizine) 5mg  tablet  OR Zyrtec 10mg  tablet daily. Singulair 10mg  tablet daily.  Ryaltris (olopatadine/mometasone) two sprays per nostril 2 times daily as needed for nasal congestion or drainage.  Sample provided.   Pataday (olopatadine) one drop per eye daily IF NEEDED for itchy/watery eyes.   - You can use an extra dose of the antihistamine, if needed, for  breakthrough symptoms.  - Consider nasal saline rinses 1-2 times daily to remove allergens from the nasal cavities as well as help with mucous clearance (this is especially helpful to do before the nasal sprays are given) - Consider allergy shots as a means of long-term control if medication management is not effective enough. - Allergy shots "re-train" and "reset" the immune system to ignore environmental allergens and decrease the resulting immune response to those allergens (sneezing, itchy watery eyes, runny nose, nasal congestion, etc).    - Allergy shots improve symptoms in 75-85% of patients.   Reactive airway - Daily controller medication(s): Pulmicort Flexhaler 2 puffs twice daily - Prior to physical activity: albuterol 2 puffs 10-15 minutes before physical activity. - Rescue medications: albuterol 2-4 puffs every 4-6 hours as needed  - Asthma control goals:  * Full participation in all desired activities (may  need albuterol before activity) * Albuterol use two time or less a week on average (not counting use with activity) * Cough interfering with sleep two time or less a month * Oral steroids no more than once a year * No hospitalizations  Follow-up in 6 months or sooner if needed  I appreciate the opportunity to take part in Chia's care. Please do not hesitate to contact me with questions.  Sincerely,   Margo Aye, MD Allergy/Immunology [facility]

## 2022-08-18 ENCOUNTER — Ambulatory Visit (INDEPENDENT_AMBULATORY_CARE_PROVIDER_SITE_OTHER): Payer: 59 | Admitting: Family Medicine

## 2022-08-18 ENCOUNTER — Encounter: Payer: Self-pay | Admitting: Family Medicine

## 2022-08-18 VITALS — BP 124/80 | HR 79 | Temp 98.4°F | Resp 17 | Ht 67.0 in | Wt 201.5 lb

## 2022-08-18 DIAGNOSIS — H6123 Impacted cerumen, bilateral: Secondary | ICD-10-CM | POA: Diagnosis not present

## 2022-08-18 NOTE — Progress Notes (Signed)
   Subjective:    Patient ID: Tracy Burch, female    DOB: May 16, 1980, 42 y.o.   MRN: [id number]  HPI R ear fullness- pt was told by allergist that her R ear needed to be flushed.  Increased ear itching, some echoing.  Denies pain.  No drainage.  Pt here today for cerumen removal and consents for treatment in both ears.   Review of Systems For ROS see HPI     Objective:   Physical Exam Vitals reviewed.  Constitutional:      General: She is not in acute distress.    Appearance: Normal appearance. She is not ill-appearing.  HENT:     Head: Normocephalic and atraumatic.     Right Ear: There is impacted cerumen (successfully irrigated and TM WNL).     Left Ear: There is impacted cerumen (successfully irrigated and TM WNL).  Neurological:     Mental Status: She is alert.           Assessment & Plan:   Hearing loss due to cerumen impaction- pt consented for bilateral ear irrigation and tolerated w/o difficulty.  Had immediate relief on both sides as she felt ears 'open up'.

## 2022-09-07 ENCOUNTER — Other Ambulatory Visit: Payer: Self-pay | Admitting: Family Medicine

## 2022-09-09 ENCOUNTER — Telehealth: Payer: Self-pay | Admitting: Family Medicine

## 2022-09-09 NOTE — Telephone Encounter (Signed)
Encourage patient to contact the pharmacy for refills or they can request refills through [facility]  (Please schedule appointment if patient has not been seen in over a year)    What pharmACY WOULD THEY LIKE THIS SENT TO:  [id number] - [place] (SE), [place] - [address]   MEDICATION NAME & DOSE: VENTOLIN HFA 108 (90 Base) MCG/ACT inhaler  NOTES/COMMENTS FROM PATIENT: Patient wants generic NOT DAW because won't pay for it.     Front office please notify patient: It takes 48-72 hours to process rx refill requests Ask patient to call pharmacy to ensure rx is ready before heading there.

## 2022-09-11 DIAGNOSIS — D649 Anemia, unspecified: Secondary | ICD-10-CM | POA: Diagnosis not present

## 2022-09-11 DIAGNOSIS — Z6832 Body mass index (BMI) 32.0-32.9, adult: Secondary | ICD-10-CM | POA: Diagnosis not present

## 2022-09-18 ENCOUNTER — Encounter: Payer: Self-pay | Admitting: Family Medicine

## 2022-09-18 MED ORDER — ALBUTEROL SULFATE HFA 108 (90 BASE) MCG/ACT IN AERS: INHALATION_SPRAY | RESPIRATORY_TRACT | 3 refills | Status: DC

## 2022-09-24 ENCOUNTER — Ambulatory Visit: Payer: 59

## 2022-09-24 ENCOUNTER — Telehealth: Payer: Self-pay

## 2022-09-24 ENCOUNTER — Other Ambulatory Visit: Payer: Self-pay | Admitting: Family Medicine

## 2022-09-24 ENCOUNTER — Ambulatory Visit (INDEPENDENT_AMBULATORY_CARE_PROVIDER_SITE_OTHER): Payer: 59

## 2022-09-24 ENCOUNTER — Other Ambulatory Visit (HOSPITAL_COMMUNITY): Payer: Self-pay

## 2022-09-24 DIAGNOSIS — Z111 Encounter for screening for respiratory tuberculosis: Secondary | ICD-10-CM | POA: Diagnosis not present

## 2022-09-24 NOTE — Telephone Encounter (Signed)
Patient Advocate Encounter   Received notification from [employer] that prior authorization is required for Albuterol Sulfate HFA 108 (90 Base)MCG/ACT aerosol   Submitted: n/a Key [id number]  PA not submitted at this time. This is covered and the insurance prefers certain NDCs to be used. Test claim results in a $10.00 co-pay. NDC used for test claim: [phone]

## 2022-09-24 NOTE — Progress Notes (Signed)
Pt came in for PPD placement . Placed on left forearm . Advised to return on Friday to read placement

## 2022-09-25 NOTE — Telephone Encounter (Signed)
It is nothing for Dr Beverely Low to do with this message

## 2022-09-25 NOTE — Telephone Encounter (Signed)
I have made pt aware of the pharmacy team message in regards to her inhaler

## 2022-09-25 NOTE — Telephone Encounter (Signed)
I'm not sure that there is anything for me to do or change based on this message.

## 2022-09-26 ENCOUNTER — Ambulatory Visit: Payer: 59

## 2022-09-26 LAB — TB SKIN TEST
Induration: 0 mm
TB Skin Test: NEGATIVE

## 2022-09-26 NOTE — Progress Notes (Signed)
Pt presented to office for TB skin read, no induration or discoloration, no concerns, pt was provided a letter for work

## 2022-10-05 ENCOUNTER — Encounter: Payer: Self-pay | Admitting: Family Medicine

## 2022-10-06 ENCOUNTER — Other Ambulatory Visit: Payer: Self-pay | Admitting: Family Medicine

## 2022-10-17 DIAGNOSIS — E559 Vitamin D deficiency, unspecified: Secondary | ICD-10-CM | POA: Diagnosis not present

## 2022-10-17 DIAGNOSIS — Z6832 Body mass index (BMI) 32.0-32.9, adult: Secondary | ICD-10-CM | POA: Diagnosis not present

## 2022-10-30 ENCOUNTER — Other Ambulatory Visit: Payer: Self-pay | Admitting: Family Medicine

## 2022-10-30 DIAGNOSIS — D171 Benign lipomatous neoplasm of skin and subcutaneous tissue of trunk: Secondary | ICD-10-CM | POA: Diagnosis not present

## 2022-11-02 ENCOUNTER — Other Ambulatory Visit: Payer: Self-pay | Admitting: Family Medicine

## 2022-11-06 ENCOUNTER — Encounter: Payer: Self-pay | Admitting: Family Medicine

## 2022-11-06 MED ORDER — ZOLPIDEM TARTRATE 10 MG PO TABS: 10.0000 mg | ORAL_TABLET | Freq: Every evening | ORAL | 3 refills | Status: DC | PRN

## 2022-11-06 MED ORDER — VALACYCLOVIR HCL 500 MG PO TABS: 500.0000 mg | ORAL_TABLET | Freq: Every day | ORAL | 1 refills | Status: DC

## 2022-11-06 NOTE — Telephone Encounter (Signed)
Patient is requesting a refill of the following medications: Requested Prescriptions   Pending Prescriptions Disp Refills   valACYclovir (VALTREX) 500 MG tablet      Sig: Take by mouth. Take by mouth.    Date of patient request: 11/06/22 Last office visit: 08/18/22 Date of last refill: 02/20/23 Last refill amount: 0 Follow up time period per chart: PRN

## 2022-11-13 DIAGNOSIS — K219 Gastro-esophageal reflux disease without esophagitis: Secondary | ICD-10-CM | POA: Diagnosis not present

## 2022-11-13 DIAGNOSIS — E559 Vitamin D deficiency, unspecified: Secondary | ICD-10-CM | POA: Diagnosis not present

## 2022-11-13 DIAGNOSIS — D649 Anemia, unspecified: Secondary | ICD-10-CM | POA: Diagnosis not present

## 2022-11-13 DIAGNOSIS — Z6833 Body mass index (BMI) 33.0-33.9, adult: Secondary | ICD-10-CM | POA: Diagnosis not present

## 2022-11-24 ENCOUNTER — Other Ambulatory Visit: Payer: Self-pay | Admitting: Family Medicine

## 2022-11-27 ENCOUNTER — Telehealth: Payer: Self-pay | Admitting: Allergy

## 2022-11-27 DIAGNOSIS — Z6833 Body mass index (BMI) 33.0-33.9, adult: Secondary | ICD-10-CM | POA: Diagnosis not present

## 2022-11-27 DIAGNOSIS — K219 Gastro-esophageal reflux disease without esophagitis: Secondary | ICD-10-CM | POA: Diagnosis not present

## 2022-11-27 DIAGNOSIS — E559 Vitamin D deficiency, unspecified: Secondary | ICD-10-CM | POA: Diagnosis not present

## 2022-11-27 NOTE — Telephone Encounter (Signed)
Patient called stating she is having some symptoms due to her asthma. Patient stated she is having a nagging cough patient's states she went to her PCP and they told her it was due to her asthma. Patient is asking what she needs to do from this point.

## 2022-11-27 NOTE — Telephone Encounter (Signed)
Left message for pt to call us back about these symptoms she probably just needs to make an appt to be seen

## 2022-11-27 NOTE — Telephone Encounter (Signed)
Patient called the office back. Patient has been having symptoms for about 2 weeks now. She didn't pay much attention to it in the beginning but she noticed it hasn't stopped. Patient said that when she talks she starts to cough. She feels like she can't catch her breath. Has noticed some wheezing during the night. Patient started using albuterol Monday and is using her Pulmicort. Patient tested negative for covid either Friday or Saturday she can't remember what day exactly. I informed to continue taking medications. Patient has an appointment scheduled with primary next week. I informed to come in sooner as I don't want her going through the weekend and getting any worse. I offered openings for today and she said she couldn't. I scheduled her with Dr.Lomasney for tomorrow 11/28/22. Patient was talking fine on the phone.

## 2022-11-28 ENCOUNTER — Ambulatory Visit: Payer: 59 | Admitting: Family Medicine

## 2022-11-28 ENCOUNTER — Ambulatory Visit: Payer: Medicaid Other | Admitting: Internal Medicine

## 2022-12-02 ENCOUNTER — Ambulatory Visit: Payer: Medicaid Other | Admitting: Family

## 2022-12-02 ENCOUNTER — Ambulatory Visit: Payer: Medicaid Other | Admitting: Family Medicine

## 2022-12-05 ENCOUNTER — Encounter: Payer: Self-pay | Admitting: Family Medicine

## 2022-12-07 ENCOUNTER — Other Ambulatory Visit: Payer: Self-pay | Admitting: Family Medicine

## 2022-12-11 ENCOUNTER — Telehealth: Payer: Self-pay

## 2022-12-11 ENCOUNTER — Other Ambulatory Visit (HOSPITAL_COMMUNITY): Payer: Self-pay

## 2022-12-11 DIAGNOSIS — E559 Vitamin D deficiency, unspecified: Secondary | ICD-10-CM | POA: Diagnosis not present

## 2022-12-11 DIAGNOSIS — D649 Anemia, unspecified: Secondary | ICD-10-CM | POA: Diagnosis not present

## 2022-12-11 DIAGNOSIS — Z6833 Body mass index (BMI) 33.0-33.9, adult: Secondary | ICD-10-CM | POA: Diagnosis not present

## 2022-12-11 NOTE — Telephone Encounter (Signed)
Pharmacy Patient Advocate Encounter   Received notification from CoverMyMeds that prior authorization for Zolpidem Tartrate 10MG  tablets is required/requested.   Insurance verification completed.   The patient is insured through [facility] .   Per test claim: APPROVED from 12/11/22 to 06/09/23. Ran test claim, Copay is $4. This test claim was processed through [facility]- copay amounts may vary at other pharmacies due to pharmacy/plan contracts, or as the patient moves through the different stages of their insurance plan.   [employer] Mirza PA Case: [id number]

## 2022-12-13 ENCOUNTER — Other Ambulatory Visit: Payer: Self-pay | Admitting: Family Medicine

## 2022-12-18 MED ORDER — ZOLPIDEM TARTRATE 10 MG PO TABS: 10.0000 mg | ORAL_TABLET | Freq: Every evening | ORAL | 3 refills | Status: DC | PRN

## 2022-12-18 NOTE — Addendum Note (Signed)
Addended by: Sheliah Hatch on: 12/18/2022 04:21 PM   Modules accepted: Orders

## 2022-12-26 DIAGNOSIS — H9202 Otalgia, left ear: Secondary | ICD-10-CM | POA: Diagnosis not present

## 2023-01-02 ENCOUNTER — Other Ambulatory Visit: Payer: Self-pay | Admitting: Family Medicine

## 2023-01-05 ENCOUNTER — Other Ambulatory Visit: Payer: Self-pay | Admitting: Family Medicine

## 2023-01-07 DIAGNOSIS — N760 Acute vaginitis: Secondary | ICD-10-CM | POA: Diagnosis not present

## 2023-01-14 DIAGNOSIS — K219 Gastro-esophageal reflux disease without esophagitis: Secondary | ICD-10-CM | POA: Diagnosis not present

## 2023-01-14 DIAGNOSIS — Z6833 Body mass index (BMI) 33.0-33.9, adult: Secondary | ICD-10-CM | POA: Diagnosis not present

## 2023-01-22 DIAGNOSIS — Z6833 Body mass index (BMI) 33.0-33.9, adult: Secondary | ICD-10-CM | POA: Diagnosis not present

## 2023-01-22 DIAGNOSIS — R7303 Prediabetes: Secondary | ICD-10-CM | POA: Diagnosis not present

## 2023-02-02 ENCOUNTER — Other Ambulatory Visit (HOSPITAL_COMMUNITY): Payer: Self-pay

## 2023-02-02 ENCOUNTER — Telehealth: Payer: Self-pay

## 2023-02-02 NOTE — Telephone Encounter (Signed)
Forwarding message to provider for next step.  Preferred medications:  Alvesco Inhaler ArmonAirT DigihalerT Arnuity Ellipta Inhaler Pulmicort Flexhaler  Asmanex HFA Inhaler / Twisthaler Pulmicort Respules 0.25mg , 0.5mg , 1mg  budesonide suspension 0.25mg , 0.5mg , 1mg  (generic for Pulmicort  Respules) Flovent Diskus / HFA Inhaler fluticasone propionate HFA / diskus (generic for Flovent HFA / Diskus) QVAR RediHalerT

## 2023-02-02 NOTE — Telephone Encounter (Signed)
*  Asthma/Allergy  Prior Authorization form/request asks a question that requires your assistance. Please see the question below and advise accordingly.   Has the patient tried and failed TWO formulary preferred medications? If only one preferred drug is available, then has the patient tried and failed the one preferred drug? The preferred drug list can be found at: [email or web address]  [facility]: [id number]

## 2023-02-03 DIAGNOSIS — D171 Benign lipomatous neoplasm of skin and subcutaneous tissue of trunk: Secondary | ICD-10-CM | POA: Diagnosis not present

## 2023-02-03 HISTORY — PX: LIPOMA EXCISION: SHX5283

## 2023-02-03 NOTE — Telephone Encounter (Signed)
Per provider:  he has only used Pulmicort.  Is Pulmicort no longer covered?  I see it on this preferred list sent from [place].  What is the question here?   Per PA Team - Has the patient tried/failed any of the below inhaler?

## 2023-02-04 ENCOUNTER — Encounter: Payer: Self-pay | Admitting: Allergy

## 2023-02-04 ENCOUNTER — Other Ambulatory Visit (HOSPITAL_COMMUNITY): Payer: Self-pay

## 2023-02-04 MED ORDER — QVAR REDIHALER 80 MCG/ACT IN AERB: 2.0000 | INHALATION_SPRAY | Freq: Two times a day (BID) | RESPIRATORY_TRACT | 1 refills | Status: DC

## 2023-02-04 NOTE — Telephone Encounter (Signed)
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    [place] staff - change pt to Qvar 2 puffs twice a day    New prescription sent sent to [facility]. [place].

## 2023-02-04 NOTE — Telephone Encounter (Signed)
This is the preferred med list at this time:

## 2023-02-06 ENCOUNTER — Telehealth: Payer: Self-pay | Admitting: Allergy

## 2023-02-06 NOTE — Telephone Encounter (Signed)
Patient called stating a PA is needed for medication Budesonide (PULMICORT FLEXHALER) 90 MCG/ACT inhaler [[id number]]

## 2023-02-07 ENCOUNTER — Other Ambulatory Visit: Payer: Self-pay | Admitting: Family Medicine

## 2023-02-09 ENCOUNTER — Telehealth: Payer: Self-pay

## 2023-02-09 ENCOUNTER — Other Ambulatory Visit (HOSPITAL_COMMUNITY): Payer: Self-pay

## 2023-02-09 NOTE — Telephone Encounter (Signed)
Pharmacy Patient Advocate Encounter   Received notification from Pt Calls Messages that prior authorization for Pulmicort Flexhaler 90MCG/ACT aerosol powder is required/requested.   Insurance verification completed.   The patient is insured through [facility] .   Per test claim: PA required; PA started via CoverMyMeds. KEY   . Waiting for clinical questions to populate.

## 2023-02-12 ENCOUNTER — Encounter: Payer: Self-pay | Admitting: Allergy

## 2023-02-12 ENCOUNTER — Ambulatory Visit (INDEPENDENT_AMBULATORY_CARE_PROVIDER_SITE_OTHER): Payer: Medicaid Other | Admitting: Allergy

## 2023-02-12 ENCOUNTER — Other Ambulatory Visit: Payer: Self-pay

## 2023-02-12 VITALS — BP 112/70 | HR 77 | Temp 98.2°F | Resp 16 | Wt 205.1 lb

## 2023-02-12 DIAGNOSIS — J4530 Mild persistent asthma, uncomplicated: Secondary | ICD-10-CM | POA: Diagnosis not present

## 2023-02-12 DIAGNOSIS — J3089 Other allergic rhinitis: Secondary | ICD-10-CM

## 2023-02-12 DIAGNOSIS — H1013 Acute atopic conjunctivitis, bilateral: Secondary | ICD-10-CM

## 2023-02-12 MED ORDER — DYMISTA 137-50 MCG/ACT NA SUSP: 1.0000 | Freq: Two times a day (BID) | NASAL | 5 refills | Status: DC

## 2023-02-12 MED ORDER — MONTELUKAST SODIUM 10 MG PO TABS: 10.0000 mg | ORAL_TABLET | Freq: Every day | ORAL | 1 refills | Status: DC

## 2023-02-12 MED ORDER — LEVOCETIRIZINE DIHYDROCHLORIDE 5 MG PO TABS: 5.0000 mg | ORAL_TABLET | Freq: Every evening | ORAL | 5 refills | Status: DC

## 2023-02-12 MED ORDER — ALBUTEROL SULFATE HFA 108 (90 BASE) MCG/ACT IN AERS: INHALATION_SPRAY | RESPIRATORY_TRACT | 1 refills | Status: DC

## 2023-02-12 NOTE — Progress Notes (Signed)
Follow-up Note  RE: Tracy Burch MRN: [id number] DOB: 02/03/1981 Date of Office Visit: 02/12/2023   History of present illness: Tracy Burch is a 42 y.o. female presenting today for follow-up of allergic rhinitis, reactive airway.  She was last seen in the office on 08/14/2022 by myself.  Discussed the use of AI scribe software for clinical note transcription with the patient, who gave verbal consent to proceed.  She presents with a change in her breathing pattern. She reports a sensation of 'rattling' and increased effort to breathe, particularly noticeable at night when the environment is calm. She also describes episodes of uncontrollable coughing at night, which are partially relieved by the use of a rescue inhaler. The patient has been without her usual maintenance inhaler, Pulmicort, for approximately a week due to insurance issues.  Due to the she was prescribed a different inhaler, Qvar, recently which is now available at her pharmacy. She reports using the rescue inhaler at least twice a week due to the absence of the Pulmicort.  She has not required any urgent care visits, emergency department visits, or steroid treatments for her respiratory symptoms since her last visit.   In addition to her respiratory symptoms, the patient also reports feeling congested, but continues to take her prescribed allergy medications, including Ryaltris and an antihistamine (cetirizine). She has not needed to use her prescribed eye drops.  Review of systems: 10pt ROS negative unless noted above in HPI   All other systems negative unless noted above in HPI  Past medical/social/surgical/family history have been reviewed and are unchanged unless specifically indicated below.  No changes  Medication List: Current Outpatient Medications  Medication Sig Dispense Refill   Budesonide (PULMICORT FLEXHALER) 90 MCG/ACT inhaler Inhale 2 puffs into the lungs 2 (two) times daily. 1 each 5    DYMISTA 137-50 MCG/ACT SUSP Place 1 spray into the nose 2 (two) times daily. 23 g 5   hydrochlorothiazide (HYDRODIURIL) 12.5 MG tablet Take 1 tablet by mouth once daily 30 tablet 0   norethindrone (MICRONOR) 0.35 MG tablet Take 1 tablet by mouth daily.     Olopatadine-Mometasone (RYALTRIS) X543819 MCG/ACT SUSP Place 2 sprays into the nose 2 (two) times daily as needed. 29 g 5   omeprazole (PRILOSEC) 40 MG capsule Take 1 capsule by mouth once daily 30 capsule 0   QVAR REDIHALER 80 MCG/ACT inhaler Inhale 2 puffs into the lungs 2 (two) times daily. 10.6 g 1   sertraline (ZOLOFT) 50 MG tablet Take 1 tablet by mouth once daily 90 tablet 0   valACYclovir (VALTREX) 500 MG tablet Take 1 tablet (500 mg total) by mouth daily. Take by mouth. 90 tablet 1   Vitamin D, Ergocalciferol, (DRISDOL) 1.25 MG (50000 UNIT) CAPS capsule Take 1 capsule (50,000 Units total) by mouth every 7 (seven) days. 7 capsule 12   zolpidem (AMBIEN) 10 MG tablet Take 1 tablet (10 mg total) by mouth at bedtime as needed. for sleep 30 tablet 3   albuterol (VENTOLIN HFA) 108 (90 Base) MCG/ACT inhaler INHALE 2 PUFFS BY MOUTH EVERY 6 HOURS AS NEEDED FOR WHEEZING FOR SHORTNESS OF BREATH 18 g 1   levocetirizine (XYZAL) 5 MG tablet Take 1 tablet (5 mg total) by mouth every evening. 30 tablet 5   montelukast (SINGULAIR) 10 MG tablet Take 1 tablet (10 mg total) by mouth at bedtime. 90 tablet 1   No current facility-administered medications for this visit.     Known medication allergies: No Known  Allergies   Physical examination: Blood pressure 112/70, pulse 77, temperature 98.2 F (36.8 C), temperature source Temporal, resp. rate 16, weight 205 lb 1.6 oz (93 kg), SpO2 100%.  General: Alert, interactive, in no acute distress. HEENT: PERRLA, TMs pearly gray, turbinates minimally edematous without discharge, post-pharynx non erythematous. Neck: Supple without lymphadenopathy. Lungs: Clear to auscultation without wheezing, rhonchi or rales.  {no increased work of breathing. CV: Normal S1, S2 without murmurs. Abdomen: Nondistended, nontender. Skin: Warm and dry, without lesions or rashes. Extremities:  No clubbing, cyanosis or edema. Neuro:   Grossly intact.  Diagnositics/Labs:  Spirometry: FEV1: 2.53L 89%, FVC: 2.87L 82%, ratio consistent with nonobstructive pattern  Assessment and plan:   Allergic rhinitis with conjunctivitis - Continue avoidance measures for grasses, ragweed, weeds, outdoor molds, cat, and dog. - Continue:   Xyzal (levocetirizine) 5mg  tablet  Singulair 10mg  tablet daily.  Dymista 1 spray per nostril 2 times daily as needed for nasal congestion or drainage.   Pataday (olopatadine) one drop per eye daily IF NEEDED for itchy/watery eyes.   - You can use an extra dose of the antihistamine, if needed, for breakthrough symptoms.  - Consider nasal saline rinses 1-2 times daily to remove allergens from the nasal cavities as well as help with mucous clearance (this is especially helpful to do before the nasal sprays are given) - Consider allergy shots as a means of long-term control if medication management is not effective enough. - Allergy shots "re-train" and "reset" the immune system to ignore environmental allergens and decrease the resulting immune response to those allergens (sneezing, itchy watery eyes, runny nose, nasal congestion, etc).    - Allergy shots improve symptoms in 75-85% of patients.   Reactive airway - Daily controller medication(s): Qvar 2 puffs twice daily - Prior to physical activity: albuterol 2 puffs 10-15 minutes before physical activity. - Rescue medications: albuterol 2-4 puffs every 4-6 hours as needed  - Asthma control goals:  * Full participation in all desired activities (may need albuterol before activity) * Albuterol use two time or less a week on average (not counting use with activity) * Cough interfering with sleep two time or less a month * Oral steroids no more  than once a year * No hospitalizations  Follow-up in 6 months or sooner if needed  I appreciate the opportunity to take part in Tracy Burch's care. Please do not hesitate to contact me with questions.  Sincerely,   Margo Aye, MD Allergy/Immunology [facility]

## 2023-02-12 NOTE — Patient Instructions (Addendum)
Allergic rhinitis with conjunctivitis - Continue avoidance measures for grasses, ragweed, weeds, outdoor molds, cat, and dog. - Continue:   Xyzal (levocetirizine) 5mg  tablet  Singulair 10mg  tablet daily.  Dymista 1 spray per nostril 2 times daily as needed for nasal congestion or drainage.   Pataday (olopatadine) one drop per eye daily IF NEEDED for itchy/watery eyes.   - You can use an extra dose of the antihistamine, if needed, for breakthrough symptoms.  - Consider nasal saline rinses 1-2 times daily to remove allergens from the nasal cavities as well as help with mucous clearance (this is especially helpful to do before the nasal sprays are given) - Consider allergy shots as a means of long-term control if medication management is not effective enough. - Allergy shots "re-train" and "reset" the immune system to ignore environmental allergens and decrease the resulting immune response to those allergens (sneezing, itchy watery eyes, runny nose, nasal congestion, etc).    - Allergy shots improve symptoms in 75-85% of patients.   Reactive airway - Daily controller medication(s): Qvar 2 puffs twice daily - Prior to physical activity: albuterol 2 puffs 10-15 minutes before physical activity. - Rescue medications: albuterol 2-4 puffs every 4-6 hours as needed  - Asthma control goals:  * Full participation in all desired activities (may need albuterol before activity) * Albuterol use two time or less a week on average (not counting use with activity) * Cough interfering with sleep two time or less a month * Oral steroids no more than once a year * No hospitalizations  Follow-up in 6 months or sooner if needed

## 2023-02-13 ENCOUNTER — Other Ambulatory Visit (HOSPITAL_COMMUNITY): Payer: Self-pay

## 2023-02-13 ENCOUNTER — Encounter: Payer: Self-pay | Admitting: Allergy

## 2023-02-18 ENCOUNTER — Other Ambulatory Visit: Payer: Self-pay | Admitting: Family Medicine

## 2023-03-05 DIAGNOSIS — R7303 Prediabetes: Secondary | ICD-10-CM | POA: Diagnosis not present

## 2023-03-12 ENCOUNTER — Other Ambulatory Visit: Payer: Self-pay | Admitting: Family Medicine

## 2023-03-17 ENCOUNTER — Other Ambulatory Visit: Payer: Self-pay | Admitting: Family Medicine

## 2023-03-26 DIAGNOSIS — Z6833 Body mass index (BMI) 33.0-33.9, adult: Secondary | ICD-10-CM | POA: Diagnosis not present

## 2023-03-26 DIAGNOSIS — K219 Gastro-esophageal reflux disease without esophagitis: Secondary | ICD-10-CM | POA: Diagnosis not present

## 2023-03-26 DIAGNOSIS — E559 Vitamin D deficiency, unspecified: Secondary | ICD-10-CM | POA: Diagnosis not present

## 2023-03-27 DIAGNOSIS — B3731 Acute candidiasis of vulva and vagina: Secondary | ICD-10-CM | POA: Diagnosis not present

## 2023-04-03 DIAGNOSIS — Z1152 Encounter for screening for COVID-19: Secondary | ICD-10-CM | POA: Diagnosis not present

## 2023-04-03 DIAGNOSIS — J069 Acute upper respiratory infection, unspecified: Secondary | ICD-10-CM | POA: Diagnosis not present

## 2023-04-03 DIAGNOSIS — J309 Allergic rhinitis, unspecified: Secondary | ICD-10-CM | POA: Diagnosis not present

## 2023-04-05 DIAGNOSIS — B9689 Other specified bacterial agents as the cause of diseases classified elsewhere: Secondary | ICD-10-CM | POA: Diagnosis not present

## 2023-04-05 DIAGNOSIS — J019 Acute sinusitis, unspecified: Secondary | ICD-10-CM | POA: Diagnosis not present

## 2023-04-05 DIAGNOSIS — H6123 Impacted cerumen, bilateral: Secondary | ICD-10-CM | POA: Diagnosis not present

## 2023-04-08 ENCOUNTER — Other Ambulatory Visit: Payer: Self-pay | Admitting: Family Medicine

## 2023-04-14 DIAGNOSIS — D649 Anemia, unspecified: Secondary | ICD-10-CM | POA: Insufficient documentation

## 2023-04-14 DIAGNOSIS — R7303 Prediabetes: Secondary | ICD-10-CM | POA: Insufficient documentation

## 2023-04-14 DIAGNOSIS — N951 Menopausal and female climacteric states: Secondary | ICD-10-CM | POA: Insufficient documentation

## 2023-04-15 ENCOUNTER — Encounter: Payer: Self-pay | Admitting: Family Medicine

## 2023-04-15 ENCOUNTER — Telehealth: Payer: Self-pay

## 2023-04-15 ENCOUNTER — Other Ambulatory Visit: Payer: Self-pay

## 2023-04-15 ENCOUNTER — Ambulatory Visit (INDEPENDENT_AMBULATORY_CARE_PROVIDER_SITE_OTHER): Payer: Medicaid Other | Admitting: Family Medicine

## 2023-04-15 VITALS — BP 122/72 | HR 83 | Ht 66.0 in | Wt 200.2 lb

## 2023-04-15 DIAGNOSIS — Z114 Encounter for screening for human immunodeficiency virus [HIV]: Secondary | ICD-10-CM

## 2023-04-15 DIAGNOSIS — Z1159 Encounter for screening for other viral diseases: Secondary | ICD-10-CM

## 2023-04-15 DIAGNOSIS — E559 Vitamin D deficiency, unspecified: Secondary | ICD-10-CM

## 2023-04-15 DIAGNOSIS — F419 Anxiety disorder, unspecified: Secondary | ICD-10-CM

## 2023-04-15 DIAGNOSIS — E66811 Obesity, class 1: Secondary | ICD-10-CM | POA: Diagnosis not present

## 2023-04-15 DIAGNOSIS — Z0000 Encounter for general adult medical examination without abnormal findings: Secondary | ICD-10-CM | POA: Diagnosis not present

## 2023-04-15 DIAGNOSIS — Z23 Encounter for immunization: Secondary | ICD-10-CM | POA: Diagnosis not present

## 2023-04-15 LAB — VITAMIN D 25 HYDROXY (VIT D DEFICIENCY, FRACTURES): VITD: 19.16 ng/mL — ABNORMAL LOW (ref 30.00–100.00)

## 2023-04-15 LAB — CBC WITH DIFFERENTIAL/PLATELET
Basophils Absolute: 0 10*3/uL (ref 0.0–0.1)
Basophils Relative: 0.5 % (ref 0.0–3.0)
Eosinophils Absolute: 0.3 10*3/uL (ref 0.0–0.7)
Eosinophils Relative: 3.2 % (ref 0.0–5.0)
HCT: 33.6 % — ABNORMAL LOW (ref 36.0–46.0)
Hemoglobin: 10.5 g/dL — ABNORMAL LOW (ref 12.0–15.0)
Lymphocytes Relative: 40.6 % (ref 12.0–46.0)
Lymphs Abs: 3.5 10*3/uL (ref 0.7–4.0)
MCHC: 31.3 g/dL (ref 30.0–36.0)
MCV: 81.4 fL (ref 78.0–100.0)
Monocytes Absolute: 0.4 10*3/uL (ref 0.1–1.0)
Monocytes Relative: 5.2 % (ref 3.0–12.0)
Neutro Abs: 4.4 10*3/uL (ref 1.4–7.7)
Neutrophils Relative %: 50.5 % (ref 43.0–77.0)
Platelets: 462 10*3/uL — ABNORMAL HIGH (ref 150.0–400.0)
RBC: 4.13 Mil/uL (ref 3.87–5.11)
RDW: 17.9 % — ABNORMAL HIGH (ref 11.5–15.5)
WBC: 8.7 10*3/uL (ref 4.0–10.5)

## 2023-04-15 LAB — BASIC METABOLIC PANEL
BUN: 9 mg/dL (ref 6–23)
CO2: 27 meq/L (ref 19–32)
Calcium: 9.5 mg/dL (ref 8.4–10.5)
Chloride: 101 meq/L (ref 96–112)
Creatinine, Ser: 0.7 mg/dL (ref 0.40–1.20)
GFR: 107.7 mL/min (ref >60.00)
Glucose, Bld: 101 mg/dL — ABNORMAL HIGH (ref 70–99)
Potassium: 3.9 meq/L (ref 3.5–5.1)
Sodium: 135 meq/L (ref 135–145)

## 2023-04-15 LAB — LIPID PANEL
Cholesterol: 192 mg/dL (ref 0–200)
HDL: 52.4 mg/dL (ref >39.00)
LDL Cholesterol: 131 mg/dL — ABNORMAL HIGH (ref 0–99)
NonHDL: 139.2
Total CHOL/HDL Ratio: 4
Triglycerides: 43 mg/dL (ref 0.0–149.0)
VLDL: 8.6 mg/dL (ref 0.0–40.0)

## 2023-04-15 LAB — HEPATIC FUNCTION PANEL
ALT: 10 U/L (ref 0–35)
AST: 17 U/L (ref 0–37)
Albumin: 4 g/dL (ref 3.5–5.2)
Alkaline Phosphatase: 93 U/L (ref 39–117)
Bilirubin, Direct: 0.1 mg/dL (ref 0.0–0.3)
Total Bilirubin: 0.3 mg/dL (ref 0.2–1.2)
Total Protein: 7.8 g/dL (ref 6.0–8.3)

## 2023-04-15 LAB — TSH: TSH: 1.24 u[IU]/mL (ref 0.35–5.50)

## 2023-04-15 MED ORDER — VITAMIN D (ERGOCALCIFEROL) 1.25 MG (50000 UNIT) PO CAPS: 50000.0000 [IU] | ORAL_CAPSULE | ORAL | 0 refills | Status: AC

## 2023-04-15 MED ORDER — OMEPRAZOLE 40 MG PO CPDR: 40.0000 mg | DELAYED_RELEASE_CAPSULE | Freq: Every day | ORAL | 0 refills | Status: DC

## 2023-04-15 MED ORDER — SERTRALINE HCL 100 MG PO TABS: 100.0000 mg | ORAL_TABLET | Freq: Every day | ORAL | 1 refills | Status: DC

## 2023-04-15 NOTE — Assessment & Plan Note (Signed)
 Check labs and replete prn.

## 2023-04-15 NOTE — Telephone Encounter (Signed)
-----   Message from Laymon Priest sent at 04/15/2023  4:01 PM EST ----- Vit D is low.  Based on this, we need to start 50,000 units weekly x12 weeks in addition to daily OTC supplement of at least 2000 units.   Hemoglobin (blood count) is slightly low but stable.  Please make sure you are taking a daily multivitamin w/ iron.  Your LDL (bad cholesterol) has increased from 108 --> 131.  This will improve w/ healthy diet and regular exercise.  No need for medication at this time but we will continue to follow.

## 2023-04-15 NOTE — Patient Instructions (Signed)
 Follow up in 1 year or as needed We'll notify you of your lab results and make any changes if needed Keep up the good work on healthy diet and regular exercise- you look great!! INCREASE the Sertraline  to 100mg  daily and keep me updated Call with any questions or concerns Stay Safe!  Stay Healthy! Happy New Year!!!

## 2023-04-15 NOTE — Assessment & Plan Note (Signed)
Ongoing issue for pt.  Encouraged healthy diet and regular exercise.  Check labs to risk stratify.  Will follow. 

## 2023-04-15 NOTE — Progress Notes (Signed)
   Subjective:    Patient ID: Tracy Burch, female    DOB: [birth year], 43 y.o.   MRN: [id number]  HPI CPE- UTD on pap/mammo, flu.  Due for Tdap.  Patient Care Team    Relationship Specialty Notifications Start End  Jess Morita, MD PCP - General Family Medicine  11/28/22   Ivery Marking, MD Consulting Physician Obstetrics and Gynecology  04/15/23      Health Maintenance  Topic Date Due   Pneumococcal Vaccine 22-70 Years old (1 of 2 - PCV) Never done   HIV Screening  Never done   Hepatitis C Screening  Never done   Cervical Cancer Screening (HPV/Pap Cotest)  05/20/2021   DTaP/Tdap/Td (2 - Td or Tdap) 10/27/2022   INFLUENZA VACCINE  10/30/2022   COVID-19 Vaccine (5 - 2024-25 season) 11/30/2022   HPV VACCINES  Aged Out      Review of Systems Patient reports no vision/ hearing changes, adenopathy,fever, weight change,  persistant/recurrent hoarseness , swallowing issues, chest pain, palpitations, edema, persistant/recurrent cough, hemoptysis, dyspnea (rest/exertional/paroxysmal nocturnal), gastrointestinal bleeding (melena, rectal bleeding), abdominal pain, significant heartburn, bowel changes, GU symptoms (dysuria, hematuria, incontinence), Gyn symptoms (abnormal  bleeding, pain),  syncope, focal weakness, memory loss, numbness & tingling, skin/hair/nail changes, abnormal bruising or bleeding.   + anxiety- deteriorated.  Currently on Sertraline  50mg  daily.  Feels more anxious when things are relaxed/quiet.  Occurring daily.  Pt is not sleeping well despite taking Ambien      Objective:   Physical Exam General Appearance:    Alert, cooperative, no distress, appears stated age  Head:    Normocephalic, without obvious abnormality, atraumatic  Eyes:    PERRL, conjunctiva/corneas clear, EOM's intact both eyes  Ears:    Normal TM's and external ear canals, both ears  Nose:   Nares normal, septum midline, mucosa normal, no drainage    or sinus tenderness  Throat:   Lips,  mucosa, and tongue normal; teeth and gums normal  Neck:   Supple, symmetrical, trachea midline, no adenopathy;    Thyroid : no enlargement/tenderness/nodules  Back:     Symmetric, no curvature, ROM normal, no CVA tenderness  Lungs:     Clear to auscultation bilaterally, respirations unlabored  Chest Wall:    No tenderness or deformity   Heart:    Regular rate and rhythm, S1 and S2 normal, no murmur, rub   or gallop  Breast Exam:    Deferred to GYN  Abdomen:     Soft, non-tender, bowel sounds active all four quadrants,    no masses, no organomegaly  Genitalia:    Deferred to GYN  Rectal:    Extremities:   Extremities normal, atraumatic, no cyanosis or edema  Pulses:   2+ and symmetric all extremities  Skin:   Skin color, texture, turgor normal, no rashes or lesions  Lymph nodes:   Cervical, supraclavicular, and axillary nodes normal  Neurologic:   CNII-XII intact, normal strength, sensation and reflexes    throughout          Assessment & Plan:

## 2023-04-15 NOTE — Telephone Encounter (Signed)
 Vitamin D  has been sent Pt has been notified  Pt has no concerns

## 2023-04-15 NOTE — Assessment & Plan Note (Signed)
 Deteriorated.  Pt feels more anxious when things are quiet and she is trying to relax.  Impacting sleep.  Feeling more 'on edge'.  Will increase Sertraline  to 100mg  daily and monitor closely.  Pt expressed understanding and is in agreement w/ plan.

## 2023-04-15 NOTE — Assessment & Plan Note (Signed)
 Pt's PE WNL w/ exception of BMI.  UTD on pap, mammo, flu.  Tdap given today.  Check labs.  Anticipatory guidance provided.

## 2023-04-16 LAB — HIV ANTIBODY (ROUTINE TESTING W REFLEX): HIV 1&2 Ab, 4th Generation: NONREACTIVE

## 2023-04-16 LAB — HEPATITIS C ANTIBODY: Hepatitis C Ab: NONREACTIVE

## 2023-04-24 ENCOUNTER — Telehealth: Payer: Medicaid Other | Admitting: Family Medicine

## 2023-04-24 ENCOUNTER — Ambulatory Visit: Payer: Self-pay | Admitting: Family Medicine

## 2023-04-24 DIAGNOSIS — J019 Acute sinusitis, unspecified: Secondary | ICD-10-CM | POA: Diagnosis not present

## 2023-04-24 DIAGNOSIS — J40 Bronchitis, not specified as acute or chronic: Secondary | ICD-10-CM

## 2023-04-24 DIAGNOSIS — B9689 Other specified bacterial agents as the cause of diseases classified elsewhere: Secondary | ICD-10-CM | POA: Diagnosis not present

## 2023-04-24 MED ORDER — PROMETHAZINE-DM 6.25-15 MG/5ML PO SYRP
5.0000 mL | ORAL_SOLUTION | Freq: Four times a day (QID) | ORAL | 0 refills | Status: AC | PRN
Start: 2023-04-24 — End: 2023-05-04

## 2023-04-24 MED ORDER — DOXYCYCLINE HYCLATE 100 MG PO TABS: 100.0000 mg | ORAL_TABLET | Freq: Two times a day (BID) | ORAL | 0 refills | Status: AC

## 2023-04-24 NOTE — Patient Instructions (Signed)

## 2023-04-24 NOTE — Telephone Encounter (Signed)
Copied from CRM [id number]. Topic: Appointments - Appointment Scheduling >> Apr 24, 2023  8:52 AM Louie Casa B wrote: Patient/patient representative is calling to schedule an appointment. Refer to attachments for appointment information.  Patient was seen last week but she is not getting any better   Chief Complaint: Sinus Infection on Antibiotic Follow Up Symptoms: Runny Nose, Productive Cough Frequency: Ongoing Pertinent Negatives: Patient denies distress like symptoms Disposition: [] ED /[] Urgent Care (no appt availability in office) / [] Appointment(In office/virtual)/ [x]  [place] Virtual Care/ [] Home Care/ [] Refused Recommended Disposition /[] [place] Mobile [facility]/ []  Follow-up with PCP Additional Notes: D. Tercero is a 43 year old female being triaged for a sinus infection follow up while being treated for antibiotics. Patient has been treated with Amoxicillin initially. The patient is still experiencing symptoms that which she started with including a cough, runny nose, but no fever. No in office availability, opted for virtual urgent care.    Reason for Disposition  [1] Taking antibiotic > 72 hours (3 days) AND [2] sinus pain not improved  Answer Assessment - Initial Assessment Questions 1. ANTIBIOTIC: "What antibiotic are you taking?" "How many times a day?"     Amoxicillin  2. ONSET: "When was the antibiotic started?"     The 15th  3. PAIN: "How bad is the sinus pain?"   (Scale 1-10; mild, moderate or severe)   - MILD (1-3): doesn't interfere with normal activities    - MODERATE (4-7): interferes with normal activities (e.g., work or school) or awakens from sleep   - SEVERE (8-10): excruciating pain and patient unable to do any normal activities        Middle of the head  4. FEVER: "Do you have a fever?" If Yes, ask: "What is it, how was it measured, and when did it start?"      No  5. SYMPTOMS: "Are there any other symptoms you're concerned about?" If Yes, ask: "When did it  start?"     Runny Nose, Cough-Productive (Green),   6. PREGNANCY: "Is there any chance you are pregnant?" "When was your last menstrual period?"     No, 04-09-2023  Protocols used: Sinus Infection on Antibiotic Follow-up Call-A-[facility]

## 2023-04-24 NOTE — Progress Notes (Signed)
Virtual Visit Consent   Tracy Burch, you are scheduled for a virtual visit with a [facility] provider today. Just as with appointments in the office, your consent must be obtained to participate. Your consent will be active for this visit and any virtual visit you may have with one of our providers in the next 365 days. If you have a MyChart account, a copy of this consent can be sent to you electronically.  As this is a virtual visit, video technology does not allow for your provider to perform a traditional examination. This may limit your provider's ability to fully assess your condition. If your provider identifies any concerns that need to be evaluated in person or the need to arrange testing (such as labs, EKG, etc.), we will make arrangements to do so. Although advances in technology are sophisticated, we cannot ensure that it will always work on either your end or our end. If the connection with a video visit is poor, the visit may have to be switched to a telephone visit. With either a video or telephone visit, we are not always able to ensure that we have a secure connection.  By engaging in this virtual visit, you consent to the provision of healthcare and authorize for your insurance to be billed (if applicable) for the services provided during this visit. Depending on your insurance coverage, you may receive a charge related to this service.  I need to obtain your verbal consent now. Are you willing to proceed with your visit today? Tracy Burch has provided verbal consent on 04/24/2023 for a virtual visit (video or telephone). Georgana Curio, FNP  Date: 04/24/2023 11:05 AM  Virtual Visit via Video Note   I, Georgana Curio, connected with  Tracy Burch  ([id number], 1980-08-31) on 04/24/23 at 11:00 AM EST by a video-enabled telemedicine application and verified that I am speaking with the correct person using two identifiers.  Location: Patient: Virtual Visit Location  Patient: Home Provider: Virtual Visit Location Provider: Home Office   I discussed the limitations of evaluation and management by telemedicine and the availability of in person appointments. The patient expressed understanding and agreed to proceed.    History of Present Illness: Tracy Burch is a 43 y.o. who identifies as a female who was assigned female at birth, and is being seen today for sinus pain and pressure over frontal sinuses, no fever, cough at night, green sinus drainage. Sx off and on since early January now worsening. She will do a covid test at home. [name]  HPI: HPI  Problems:  Patient Active Problem List   Diagnosis Date Noted   Anemia 04/14/2023   Menopausal and female climacteric states 04/14/2023   Prediabetes 04/14/2023   GERD (gastroesophageal reflux disease) 07/26/2021   HTN (hypertension) 04/11/2021   Vitamin D deficiency 11/18/2016   Obesity (BMI 30.0-34.9) 11/18/2016   Anxiety 11/18/2016   Patellofemoral arthralgia of left knee 05/27/2013   Numerous moles 05/27/2013   Insomnia 12/04/2011   Physical exam, annual 10/22/2011    Allergies: No Known Allergies Medications:  Current Outpatient Medications:    albuterol (VENTOLIN HFA) 108 (90 Base) MCG/ACT inhaler, INHALE 2 PUFFS BY MOUTH EVERY 6 HOURS AS NEEDED FOR WHEEZING FOR SHORTNESS OF BREATH, Disp: 18 g, Rfl: 1   Budesonide (PULMICORT FLEXHALER) 90 MCG/ACT inhaler, Inhale 2 puffs into the lungs 2 (two) times daily., Disp: 1 each, Rfl: 5   DYMISTA 137-50 MCG/ACT SUSP, Place 1 spray into the nose 2 (  two) times daily., Disp: 23 g, Rfl: 5   hydrochlorothiazide (HYDRODIURIL) 12.5 MG tablet, Take 1 tablet by mouth once daily, Disp: 30 tablet, Rfl: 0   levocetirizine (XYZAL) 5 MG tablet, Take 1 tablet (5 mg total) by mouth every evening., Disp: 30 tablet, Rfl: 5   montelukast (SINGULAIR) 10 MG tablet, Take 1 tablet (10 mg total) by mouth at bedtime., Disp: 90 tablet, Rfl: 1   norethindrone (MICRONOR) 0.35 MG  tablet, Take 1 tablet by mouth daily., Disp: , Rfl:    Olopatadine-Mometasone (RYALTRIS) 665-25 MCG/ACT SUSP, Place 2 sprays into the nose 2 (two) times daily as needed., Disp: 29 g, Rfl: 5   omeprazole (PRILOSEC) 40 MG capsule, Take 1 capsule (40 mg total) by mouth daily., Disp: 30 capsule, Rfl: 0   QVAR REDIHALER 80 MCG/ACT inhaler, Inhale 2 puffs into the lungs 2 (two) times daily., Disp: 10.6 g, Rfl: 1   sertraline (ZOLOFT) 100 MG tablet, Take 1 tablet (100 mg total) by mouth daily., Disp: 90 tablet, Rfl: 1   valACYclovir (VALTREX) 500 MG tablet, Take 1 tablet (500 mg total) by mouth daily. Take by mouth., Disp: 90 tablet, Rfl: 1   Vitamin D, Ergocalciferol, (DRISDOL) 1.25 MG (50000 UNIT) CAPS capsule, Take 1 capsule (50,000 Units total) by mouth every 7 (seven) days., Disp: 12 capsule, Rfl: 0   zolpidem (AMBIEN) 10 MG tablet, Take 1 tablet (10 mg total) by mouth at bedtime as needed. for sleep, Disp: 30 tablet, Rfl: 3  Observations/Objective: Patient is well-developed, well-nourished in no acute distress.  Resting comfortably  at home.  Head is normocephalic, atraumatic.  No labored breathing.  Speech is clear and coherent with logical content.  Patient is alert and oriented at baseline.    Assessment and Plan: 1. Acute bacterial sinusitis (Primary)  2. Bronchitis  Increase fuids, flonase, mucinex, tylenol or ibuprofen as directed, UC as needed. Take at home covid test.   Follow Up Instructions: I discussed the assessment and treatment plan with the patient. The patient was provided an opportunity to ask questions and all were answered. The patient agreed with the plan and demonstrated an understanding of the instructions.  A copy of instructions were sent to the patient via MyChart unless otherwise noted below.     The patient was advised to call back or seek an in-person evaluation if the symptoms worsen or if the condition fails to improve as anticipated.    Georgana Curio, FNP

## 2023-04-24 NOTE — Telephone Encounter (Signed)
Appt scheduled for Monday, this message is an FYI about call and informing you she is not feeling better

## 2023-04-28 ENCOUNTER — Encounter: Payer: Self-pay | Admitting: Family Medicine

## 2023-04-28 MED ORDER — ZOLPIDEM TARTRATE 10 MG PO TABS: 10.0000 mg | ORAL_TABLET | Freq: Every evening | ORAL | 3 refills | Status: DC | PRN

## 2023-04-28 NOTE — Telephone Encounter (Signed)
Rx Team:  Patient needs PA on Zolpidem(Ambien) 10 mg  Hopefully this can be expedited as patient is out of medication

## 2023-04-28 NOTE — Telephone Encounter (Signed)
Dr Beverely Low  Requested Prescriptions   Pending Prescriptions Disp Refills   zolpidem (AMBIEN) 10 MG tablet 30 tablet 3    Sig: Take 1 tablet (10 mg total) by mouth at bedtime as needed. for sleep     Date of patient request: 04/28/2023 Last office visit: 04/15/2023 Upcoming visit: Visit date not found Date of last refill: 12/18/2022 Last refill amount: 30x3

## 2023-04-29 ENCOUNTER — Other Ambulatory Visit (HOSPITAL_COMMUNITY): Payer: Self-pay

## 2023-05-01 ENCOUNTER — Telehealth: Payer: Self-pay

## 2023-05-01 ENCOUNTER — Other Ambulatory Visit (HOSPITAL_COMMUNITY): Payer: Self-pay

## 2023-05-01 NOTE — Telephone Encounter (Signed)
*  Primary  Pharmacy Patient Advocate Encounter   Received notification from Patient Advice Request messages that prior authorization for Zolpidem 10mg  is required/requested.   Insurance verification completed.   The patient is insured through [facility] .   Per test claim: The current 30 day co-pay is, $4.00.  No PA needed at this time. This test claim was processed through [facility]- copay amounts may vary at other pharmacies due to pharmacy/plan contracts, or as the patient moves through the different stages of their insurance plan.

## 2023-05-07 DIAGNOSIS — E6609 Other obesity due to excess calories: Secondary | ICD-10-CM | POA: Diagnosis not present

## 2023-05-07 DIAGNOSIS — K219 Gastro-esophageal reflux disease without esophagitis: Secondary | ICD-10-CM | POA: Diagnosis not present

## 2023-05-14 ENCOUNTER — Encounter: Payer: Self-pay | Admitting: Family Medicine

## 2023-05-19 ENCOUNTER — Encounter: Payer: Self-pay | Admitting: Family Medicine

## 2023-05-21 ENCOUNTER — Encounter: Payer: Self-pay | Admitting: Family Medicine

## 2023-05-28 DIAGNOSIS — R7303 Prediabetes: Secondary | ICD-10-CM | POA: Diagnosis not present

## 2023-05-28 DIAGNOSIS — Z6832 Body mass index (BMI) 32.0-32.9, adult: Secondary | ICD-10-CM | POA: Diagnosis not present

## 2023-06-08 ENCOUNTER — Telehealth: Payer: Self-pay | Admitting: Allergy

## 2023-06-08 MED ORDER — QVAR REDIHALER 80 MCG/ACT IN AERB: 2.0000 | INHALATION_SPRAY | Freq: Two times a day (BID) | RESPIRATORY_TRACT | 1 refills | Status: DC

## 2023-06-08 NOTE — Telephone Encounter (Signed)
 Medication refill on Qvar sent to [facility].

## 2023-06-08 NOTE — Telephone Encounter (Signed)
 Patient called stating she needs a refill on her Qvar inhaler. [facility]'s pharmacy [facility] [address].

## 2023-06-18 DIAGNOSIS — N644 Mastodynia: Secondary | ICD-10-CM | POA: Diagnosis not present

## 2023-06-18 DIAGNOSIS — R92311 Mammographic fatty tissue density, right breast: Secondary | ICD-10-CM | POA: Diagnosis not present

## 2023-06-18 LAB — HM MAMMOGRAPHY

## 2023-06-22 ENCOUNTER — Other Ambulatory Visit: Payer: Self-pay | Admitting: Family Medicine

## 2023-06-24 ENCOUNTER — Encounter: Payer: Self-pay | Admitting: Family Medicine

## 2023-07-01 DIAGNOSIS — N898 Other specified noninflammatory disorders of vagina: Secondary | ICD-10-CM | POA: Diagnosis not present

## 2023-07-01 DIAGNOSIS — R0981 Nasal congestion: Secondary | ICD-10-CM | POA: Diagnosis not present

## 2023-07-01 DIAGNOSIS — B3731 Acute candidiasis of vulva and vagina: Secondary | ICD-10-CM | POA: Diagnosis not present

## 2023-07-03 ENCOUNTER — Telehealth: Payer: Self-pay | Admitting: Pharmacy Technician

## 2023-07-03 ENCOUNTER — Other Ambulatory Visit (HOSPITAL_COMMUNITY): Payer: Self-pay

## 2023-07-03 NOTE — Telephone Encounter (Signed)
 Pharmacy Patient Advocate Encounter   Received notification from CoverMyMeds that prior authorization for Zolpidem Tartrate 10MG  tablets is required/requested.   Insurance verification completed.   The patient is insured through [facility] .   Per test claim: [id number]

## 2023-07-06 NOTE — Telephone Encounter (Signed)
 Pharmacy Patient Advocate Encounter  Received notification from [facility] that Prior Authorization for Zolpidem Tartrate 10mg  Tablets has been DENIED.  Full denial letter will be uploaded to the media tab. See denial reason below.     PA #/Case ID/Reference #: [id number]

## 2023-07-07 ENCOUNTER — Encounter: Payer: Self-pay | Admitting: Family Medicine

## 2023-07-08 ENCOUNTER — Encounter: Payer: Self-pay | Admitting: Family Medicine

## 2023-07-08 ENCOUNTER — Ambulatory Visit: Payer: Self-pay

## 2023-07-08 ENCOUNTER — Ambulatory Visit: Admitting: Family Medicine

## 2023-07-08 VITALS — BP 122/70 | HR 75 | Temp 98.0°F | Ht 66.0 in | Wt 195.0 lb

## 2023-07-08 DIAGNOSIS — B9689 Other specified bacterial agents as the cause of diseases classified elsewhere: Secondary | ICD-10-CM

## 2023-07-08 DIAGNOSIS — R42 Dizziness and giddiness: Secondary | ICD-10-CM

## 2023-07-08 DIAGNOSIS — J019 Acute sinusitis, unspecified: Secondary | ICD-10-CM | POA: Diagnosis not present

## 2023-07-08 MED ORDER — MECLIZINE HCL 25 MG PO TABS: 25.0000 mg | ORAL_TABLET | Freq: Three times a day (TID) | ORAL | 0 refills | Status: AC | PRN

## 2023-07-08 MED ORDER — AMOXICILLIN 875 MG PO TABS: 875.0000 mg | ORAL_TABLET | Freq: Two times a day (BID) | ORAL | 0 refills | Status: AC

## 2023-07-08 MED ORDER — ZOLPIDEM TARTRATE 10 MG PO TABS: 10.0000 mg | ORAL_TABLET | Freq: Every evening | ORAL | 3 refills | Status: DC | PRN

## 2023-07-08 NOTE — Telephone Encounter (Signed)
  Chief Complaint: dizziness Symptoms: mild to moderate dizziness, sinus symptoms Frequency: x 4 days Pertinent Negatives: Patient denies new unilateral numbness or weakness, changes in speech or vision, facial droop, headache, nausea, vomiting,falls Disposition: [] ED /[] Urgent Care (no appt availability in office) / [x] Appointment(In office/virtual)/ []  [facility]/ [] Home Care/ [] Refused Recommended Disposition /[] [facility]/ []  Follow-up with PCP Additional Notes: Patient reports she was seen on 07/01/23 at urgent care for sinus infection and placed on prednisone 6 day dose pack. Patient states she doesn't feel like her sinus infection has improved. Sunday she started with dizziness, mild to moderate. Patient agreeable to acute visit this afternoon with PCP.  Copied from CRM [id number]. Topic: Clinical - Red Word Triage >> Jul 08, 2023 11:27 AM Baldemar Lenis P wrote: [facility] that prompted transfer to Nurse Triage: dizziness, standing up/sitting down Reason for Disposition  [1] MODERATE dizziness (e.g., vertigo; feels very unsteady, interferes with normal activities) AND [2] has NOT been evaluated by doctor (or NP/PA) for this  Answer Assessment - Initial Assessment Questions 1. DESCRIPTION: "Describe your dizziness."     She states it feels like when you are little and you spin around a bunch and make yourself dizzy. Spinning feeling.  2. VERTIGO: "Do you feel like either you or the room is spinning or tilting?"      Yes  3. LIGHTHEADED: "Do you feel lightheaded?" (e.g., somewhat faint, woozy, weak upon standing)    Denies.  4. SEVERITY: "How bad is it?"  "Can you walk?"   - MILD: Feels slightly dizzy and unsteady, but is walking normally.   - MODERATE: Feels unsteady when walking, but not falling; interferes with normal activities (e.g., school, work).   - SEVERE: Unable to walk without falling, or requires assistance to walk without falling.     Mild to moderate.  5.  ONSET:  "When did the dizziness begin?"     Sunday.  6. AGGRAVATING FACTORS: "Does anything make it worse?" (e.g., standing, change in head position)     Sometime it improves if sitting or lying down. Worsens when sitting to standing.  7. CAUSE: "What do you think is causing the dizziness?"     She states she went to urgent care and they gave her medication for sinus infection, she feels like she still has the sinus infection.  8. RECURRENT SYMPTOM: "Have you had dizziness before?" If Yes, ask: "When was the last time?" "What happened that time?"     Denies.  9. OTHER SYMPTOMS: "Do you have any other symptoms?" (e.g., headache, weakness, numbness, vomiting, earache)     Denies.  10. PREGNANCY: "Is there any chance you are pregnant?" "When was your last menstrual period?"       LMP: Thursday or Friday of last week.  Protocols used: Dizziness - Vertigo-A-AH

## 2023-07-08 NOTE — Progress Notes (Unsigned)
   Subjective:    Patient ID: Tracy Burch, female    DOB: Jan 10, 1981, 43 y.o.   MRN: [id number]  HPI Vertigo- sxs started Sunday 4/6 w/ spinning.  Initially occurred when changing positions.  Then developed spinning w/ leaning forward, lying down.  No nausea or vomiting.  No hx of similar.  + congestion and sinus pressure.  + green nasal d/c.  No tooth pain.  + ear fullness.  Pt was seen at [facility] for sinus sxs on 4/2 and prescribed a dose pack- which she did not start.    Chronic primary insomnia- pt is again struggling w/ insurance regarding # of pills allowed monthly.  Requires Zolpidem nightly to sleep   Review of Systems For ROS see HPI     Objective:   Physical Exam Vitals reviewed.  Constitutional:      General: She is not in acute distress.    Appearance: Normal appearance. She is well-developed. She is not ill-appearing.  HENT:     Head: Normocephalic and atraumatic.     Right Ear: Tympanic membrane and ear canal normal.     Left Ear: Tympanic membrane and ear canal normal.     Nose: Mucosal edema and congestion present. No rhinorrhea.     Right Sinus: Maxillary sinus tenderness present. No frontal sinus tenderness.     Left Sinus: Maxillary sinus tenderness present. No frontal sinus tenderness.     Mouth/Throat:     Pharynx: Uvula midline. Posterior oropharyngeal erythema present. No oropharyngeal exudate.  Eyes:     Extraocular Movements: Extraocular movements intact.     Conjunctiva/sclera: Conjunctivae normal.     Pupils: Pupils are equal, round, and reactive to light.  Cardiovascular:     Rate and Rhythm: Normal rate and regular rhythm.     Heart sounds: Normal heart sounds.  Pulmonary:     Effort: Pulmonary effort is normal. No respiratory distress.     Breath sounds: Normal breath sounds. No wheezing.  Musculoskeletal:     Cervical back: Normal range of motion and neck supple.  Lymphadenopathy:     Cervical: No cervical adenopathy.  Skin:    General:  Skin is warm and dry.  Neurological:     General: No focal deficit present.     Mental Status: She is alert and oriented to person, place, and time.     Cranial Nerves: No cranial nerve deficit.     Motor: No weakness.     Coordination: Coordination normal.  Psychiatric:        Mood and Affect: Mood normal.        Behavior: Behavior normal.        Thought Content: Thought content normal.           Assessment & Plan:  Bacterial sinusitis- new.  Suspect her sinus infxn in the cause of her vertigo sxs.  Start Amox for bacterial infxn.  Encouraged her to start the Medrol dose pack as directed.  Meclizine prn.  Reviewed supportive care and red flags that should prompt return.  Pt expressed understanding and is in agreement w/ plan.   Chronic insomnia- ongoing issue for pt.  She is again having issues getting 30 day supply of Zolpidem.  Resend prescription w/ note to pharmacy in hopes of getting this approved.

## 2023-07-08 NOTE — Patient Instructions (Addendum)
 Follow up as needed or as scheduled START the Amoxicillin twice daily for sinus infection CONTINUE the allergy medication daily START the steroid pack as directed Drink LOTS of fluids USE the Meclizine as needed for dizziness CHANGE positions slowly to allow yourself time to adjust Call with any questions or concerns Hang in there!!

## 2023-07-09 ENCOUNTER — Encounter: Payer: Self-pay | Admitting: Family Medicine

## 2023-07-09 DIAGNOSIS — K219 Gastro-esophageal reflux disease without esophagitis: Secondary | ICD-10-CM | POA: Diagnosis not present

## 2023-07-09 DIAGNOSIS — Z6831 Body mass index (BMI) 31.0-31.9, adult: Secondary | ICD-10-CM | POA: Diagnosis not present

## 2023-07-13 ENCOUNTER — Encounter: Payer: Self-pay | Admitting: Family Medicine

## 2023-07-13 NOTE — Telephone Encounter (Signed)
 Yes, these antibiotics can cause a yeast infection. Can we offer this person a visit with me so we can test and talk about treatment?

## 2023-07-13 NOTE — Telephone Encounter (Signed)
 Patient has some questions regarding antibiotics, please advise

## 2023-07-14 ENCOUNTER — Encounter: Payer: Self-pay | Admitting: Family Medicine

## 2023-07-19 ENCOUNTER — Other Ambulatory Visit: Payer: Self-pay | Admitting: Family Medicine

## 2023-07-21 ENCOUNTER — Other Ambulatory Visit: Payer: Self-pay | Admitting: Family Medicine

## 2023-08-06 DIAGNOSIS — E559 Vitamin D deficiency, unspecified: Secondary | ICD-10-CM | POA: Diagnosis not present

## 2023-08-06 DIAGNOSIS — Z6831 Body mass index (BMI) 31.0-31.9, adult: Secondary | ICD-10-CM | POA: Diagnosis not present

## 2023-08-06 DIAGNOSIS — D649 Anemia, unspecified: Secondary | ICD-10-CM | POA: Diagnosis not present

## 2023-08-13 ENCOUNTER — Ambulatory Visit (INDEPENDENT_AMBULATORY_CARE_PROVIDER_SITE_OTHER): Payer: Medicaid Other | Admitting: Allergy

## 2023-08-13 ENCOUNTER — Other Ambulatory Visit: Payer: Self-pay

## 2023-08-13 ENCOUNTER — Encounter: Payer: Self-pay | Admitting: Allergy

## 2023-08-13 VITALS — BP 116/72 | HR 68 | Temp 98.4°F | Ht 66.0 in | Wt 191.0 lb

## 2023-08-13 DIAGNOSIS — J3089 Other allergic rhinitis: Secondary | ICD-10-CM | POA: Diagnosis not present

## 2023-08-13 DIAGNOSIS — J45909 Unspecified asthma, uncomplicated: Secondary | ICD-10-CM | POA: Diagnosis not present

## 2023-08-13 DIAGNOSIS — H1013 Acute atopic conjunctivitis, bilateral: Secondary | ICD-10-CM

## 2023-08-13 DIAGNOSIS — J4530 Mild persistent asthma, uncomplicated: Secondary | ICD-10-CM | POA: Diagnosis not present

## 2023-08-13 DIAGNOSIS — J45998 Other asthma: Secondary | ICD-10-CM | POA: Diagnosis not present

## 2023-08-13 MED ORDER — LEVOCETIRIZINE DIHYDROCHLORIDE 5 MG PO TABS: 5.0000 mg | ORAL_TABLET | Freq: Every evening | ORAL | 15 refills | Status: DC

## 2023-08-13 MED ORDER — DYMISTA 137-50 MCG/ACT NA SUSP: 1.0000 | Freq: Two times a day (BID) | NASAL | 5 refills | Status: DC

## 2023-08-13 MED ORDER — ALBUTEROL SULFATE HFA 108 (90 BASE) MCG/ACT IN AERS: INHALATION_SPRAY | RESPIRATORY_TRACT | 1 refills | Status: DC

## 2023-08-13 MED ORDER — BUDESONIDE-FORMOTEROL FUMARATE 160-4.5 MCG/ACT IN AERO: 2.0000 | INHALATION_SPRAY | Freq: Two times a day (BID) | RESPIRATORY_TRACT | 5 refills | Status: DC

## 2023-08-13 MED ORDER — MONTELUKAST SODIUM 10 MG PO TABS: 10.0000 mg | ORAL_TABLET | Freq: Every day | ORAL | 1 refills | Status: DC

## 2023-08-13 NOTE — Patient Instructions (Addendum)
 Allergic rhinitis with conjunctivitis - Continue avoidance measures for grasses, ragweed, weeds, outdoor molds, cat, and dog. - Continue:   Xyzal  (levocetirizine) 5mg  tablet daily. Singulair  10mg  tablet daily.  Dymista  1 spray per nostril 2 times daily for nasal congestion or drainage.   Pataday (olopatadine) one drop per eye daily IF NEEDED for itchy/watery eyes.   - You can use an extra dose of the antihistamine, if needed, for breakthrough symptoms.  - Consider nasal saline rinses 1-2 times daily to remove allergens from the nasal cavities as well as help with mucous clearance (this is especially helpful to do before the nasal sprays are given) - Consider allergy  shots as a means of long-term control if medication management is not effective enough. - Allergy  shots "re-train" and "reset" the immune system to ignore environmental allergens and decrease the resulting immune response to those allergens (sneezing, itchy watery eyes, runny nose, nasal congestion, etc).    - Allergy  shots improve symptoms in 75-85% of patients.   Reactive airway - Daily controller medication(s): stop Qvar .  Start Symbicort 160mcg 2 puffs twice a day.   Use with spacer device.  Rinse mouth after use.  This is a combination inhaler with inhaled steroid and a long-acting albuterol  components.  - Prior to physical activity: albuterol  2 puffs 10-15 minutes before physical activity. - Rescue medications: albuterol  2-4 puffs every 4-6 hours as needed  - Asthma control goals:  * Full participation in all desired activities (may need albuterol  before activity) * Albuterol  use two time or less a week on average (not counting use with activity) * Cough interfering with sleep two time or less a month * Oral steroids no more than once a year * No hospitalizations  Follow-up in 6 months or sooner if needed

## 2023-08-13 NOTE — Progress Notes (Signed)
 Follow-up Note  RE: Tracy Burch MRN: [id number] DOB: 1980/05/07 Date of Office Visit: 08/13/2023   History of present illness: Tracy Burch is a 43 y.o. female presenting today for follow-up of allergic rhinitis with conjunctivitis, reactive airway. She was last seen in the office on 02/12/23 by myself. Discussed the use of AI scribe software for clinical note transcription with the patient, who gave verbal consent to proceed.  She experiences a persistent dry cough at night, described as 'raspy', primarily occurring when lying flat. She does sleep without a pillow. The cough tends to occur when she tries to relax her body. She uses her rescue inhaler, albuterol , approximately every other night to alleviate the cough and does help relieve the cough so she can get to sleep. During the day, she occasionally feels the onset of a cough and uses the inhaler as needed.  She is currently using Qvar , two puffs in the morning and two at night. She recalls previously using Pulmicort  before her insurance changed, and anticipates another change in insurance at the end of May, which may affect her medication options.  She has been using Dymista  at night and Flonase  in the mornings. She has been taking Xyzal  and Singulair  (montelukast ) regularly, which she feels are effective in managing her symptoms. She states she can tell a difference when she misses a dose of xyzal . She has not needed to use eye drops recently.  In April, she experienced a sinus infection and was treated with a Medrol pack. She has not had any urgent care visits or hospitalizations for breathing issues since her last visit.  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 No animals at home.    Review of systems: 10pt ROS negative unless noted above in HPI  Past medical/social/surgical/family history have been reviewed and are unchanged  unless specifically indicated below.  No changes  Medication List: Current Outpatient Medications  Medication Sig Dispense Refill   albuterol  (VENTOLIN  HFA) 108 (90 Base) MCG/ACT inhaler INHALE 2 PUFFS BY MOUTH EVERY 6 HOURS AS NEEDED FOR WHEEZING FOR SHORTNESS OF BREATH 18 g 1   DYMISTA  137-50 MCG/ACT SUSP Place 1 spray into the nose 2 (two) times daily. 23 g 5   fluconazole  (DIFLUCAN ) 150 MG tablet Take 150 mg by mouth once.     hydrochlorothiazide  (HYDRODIURIL ) 12.5 MG tablet Take 1 tablet by mouth once daily 30 tablet 0   levocetirizine (XYZAL ) 5 MG tablet Take 1 tablet (5 mg total) by mouth every evening. 30 tablet 5   meclizine  (ANTIVERT ) 25 MG tablet Take 1 tablet (25 mg total) by mouth 3 (three) times daily as needed for dizziness. 30 tablet 0   montelukast  (SINGULAIR ) 10 MG tablet Take 1 tablet (10 mg total) by mouth at bedtime. 90 tablet 1   norethindrone (MICRONOR) 0.35 MG tablet Take 1 tablet by mouth daily.     Olopatadine-Mometasone (RYALTRIS ) 665-25 MCG/ACT SUSP Place 2 sprays into the nose 2 (two) times daily as needed. 29 g 5   omeprazole  (PRILOSEC) 40 MG capsule Take 1 capsule by mouth once daily 30 capsule 0   QVAR  REDIHALER 80 MCG/ACT inhaler Inhale 2 puffs into the lungs 2 (two) times daily. 10.6 g 1   sertraline  (ZOLOFT ) 100 MG tablet Take 1 tablet (100 mg total) by mouth daily. 90 tablet 1   valACYclovir  (VALTREX ) 500 MG tablet Take 1 tablet (500 mg total) by mouth daily.  Take by mouth. 90 tablet 1   Vitamin D , Ergocalciferol , (DRISDOL ) 1.25 MG (50000 UNIT) CAPS capsule Take 1 capsule (50,000 Units total) by mouth every 7 (seven) days. 12 capsule 0   zolpidem  (AMBIEN ) 10 MG tablet Take 1 tablet (10 mg total) by mouth at bedtime as needed. for sleep 30 tablet 3   Budesonide  (PULMICORT  FLEXHALER) 90 MCG/ACT inhaler Inhale 2 puffs into the lungs 2 (two) times daily. (Patient not taking: Reported on 08/13/2023) 1 each 5   No current facility-administered medications for this  visit.     Known medication allergies: No Known Allergies   Physical examination: Blood pressure 116/72, pulse 68, temperature 98.4 F (36.9 C), temperature source Temporal, height 5\' 6"  (1.676 m), weight 191 lb (86.6 kg), SpO2 100%.  General: Alert, interactive, in no acute distress. HEENT: PERRLA, TMs pearly gray, turbinates minimally edematous without discharge, post-pharynx non erythematous. Neck: Supple without lymphadenopathy. Lungs: Clear to auscultation without wheezing, rhonchi or rales. {no increased work of breathing. CV: Normal S1, S2 without murmurs. Abdomen: Nondistended, nontender. Skin: Warm and dry, without lesions or rashes. Extremities:  No clubbing, cyanosis or edema. Neuro:   Grossly intact.  Diagnositics/Labs:  Spirometry: FEV1: 2.67L 97%, FVC: 3.36L 100%, ratio consistent with nonobstructive pattern  Assessment and plan: Allergic rhinitis with conjunctivitis - Continue avoidance measures for grasses, ragweed, weeds, outdoor molds, cat, and dog. - Continue:   Xyzal  (levocetirizine) 5mg  tablet daily. Singulair  10mg  tablet daily.  Dymista  1 spray per nostril 2 times daily for nasal congestion or drainage.   Pataday (olopatadine) one drop per eye daily IF NEEDED for itchy/watery eyes.   - You can use an extra dose of the antihistamine, if needed, for breakthrough symptoms.  - Consider nasal saline rinses 1-2 times daily to remove allergens from the nasal cavities as well as help with mucous clearance (this is especially helpful to do before the nasal sprays are given) - Consider allergy  shots as a means of long-term control if medication management is not effective enough. - Allergy  shots "re-train" and "reset" the immune system to ignore environmental allergens and decrease the resulting immune response to those allergens (sneezing, itchy watery eyes, runny nose, nasal congestion, etc).    - Allergy  shots improve symptoms in 75-85% of patients.   Reactive  airway - Daily controller medication(s): stop Qvar .  Start Symbicort 160mcg 2 puffs twice a day.   Use with spacer device.  Rinse mouth after use.  This is a combination inhaler with inhaled steroid and a long-acting albuterol  components.  - Prior to physical activity: albuterol  2 puffs 10-15 minutes before physical activity. - Rescue medications: albuterol  2-4 puffs every 4-6 hours as needed  - Asthma control goals:  * Full participation in all desired activities (may need albuterol  before activity) * Albuterol  use two time or less a week on average (not counting use with activity) * Cough interfering with sleep two time or less a month * Oral steroids no more than once a year * No hospitalizations  Follow-up in 6 months or sooner if needed  I appreciate the opportunity to take part in Jennier's care. Please do not hesitate to contact me with questions.  Sincerely,   Catha Clink, MD Allergy /Immunology [facility]

## 2023-08-14 ENCOUNTER — Telehealth: Payer: Self-pay

## 2023-08-14 ENCOUNTER — Other Ambulatory Visit (HOSPITAL_COMMUNITY): Payer: Self-pay

## 2023-08-14 NOTE — Telephone Encounter (Signed)
*  Asthma/Allergy   Pharmacy Patient Advocate Encounter   Received notification from CoverMyMeds that prior authorization for Breyna 160-4.5MCG/ACT aerosol  is required/requested.   Insurance verification completed.   The patient is insured through [facility] .   Per test claim:  Brand Symbicort is preferred by the insurance.  If suggested medication is appropriate, Please send in a new RX and discontinue this one. If not, please advise as to why it's not appropriate so that we may request a Prior Authorization. Please note, some preferred medications may still require a PA.  If the suggested medications have not been trialed and there are no contraindications to their use, the PA will not be submitted, as it will not be approved.

## 2023-08-14 NOTE — Telephone Encounter (Signed)
*  Asthma/Allergy   Pharmacy Patient Advocate Encounter   Received notification from CoverMyMeds that prior authorization for Albuterol  Sulfate HFA 108 (90 Base)MCG/ACT aerosol  is required/requested.   Insurance verification completed.   The patient is insured through [facility] .   Per test claim:  Brand Ventolin  HFA is preferred by the insurance.  If suggested medication is appropriate, Please send in a new RX and discontinue this one. If not, please advise as to why it's not appropriate so that we may request a Prior Authorization. Please note, some preferred medications may still require a PA.  If the suggested medications have not been trialed and there are no contraindications to their use, the PA will not be submitted, as it will not be approved.

## 2023-08-18 ENCOUNTER — Other Ambulatory Visit: Payer: Self-pay | Admitting: Family Medicine

## 2023-08-18 MED ORDER — BUDESONIDE-FORMOTEROL FUMARATE 160-4.5 MCG/ACT IN AERO: 2.0000 | INHALATION_SPRAY | Freq: Two times a day (BID) | RESPIRATORY_TRACT | 5 refills | Status: DC

## 2023-08-18 NOTE — Addendum Note (Signed)
 Addended by: Merwyn Achilles on: 08/18/2023 05:00 PM   Modules accepted: Orders

## 2023-08-19 MED ORDER — ALBUTEROL SULFATE HFA 108 (90 BASE) MCG/ACT IN AERS: INHALATION_SPRAY | RESPIRATORY_TRACT | 1 refills | Status: DC

## 2023-08-19 NOTE — Addendum Note (Signed)
 Addended by: Merwyn Achilles on: 08/19/2023 03:21 PM   Modules accepted: Orders

## 2023-08-20 DIAGNOSIS — R635 Abnormal weight gain: Secondary | ICD-10-CM | POA: Diagnosis not present

## 2023-08-20 DIAGNOSIS — Z6831 Body mass index (BMI) 31.0-31.9, adult: Secondary | ICD-10-CM | POA: Diagnosis not present

## 2023-08-20 DIAGNOSIS — R7303 Prediabetes: Secondary | ICD-10-CM | POA: Diagnosis not present

## 2023-08-27 ENCOUNTER — Telehealth: Admitting: Family Medicine

## 2023-08-27 ENCOUNTER — Other Ambulatory Visit: Payer: Self-pay | Admitting: Family Medicine

## 2023-08-27 VITALS — Ht 65.0 in | Wt 190.0 lb

## 2023-08-27 DIAGNOSIS — J019 Acute sinusitis, unspecified: Secondary | ICD-10-CM

## 2023-08-27 DIAGNOSIS — B9689 Other specified bacterial agents as the cause of diseases classified elsewhere: Secondary | ICD-10-CM

## 2023-08-27 MED ORDER — FLUCONAZOLE 150 MG PO TABS: 150.0000 mg | ORAL_TABLET | Freq: Once | ORAL | 0 refills | Status: AC

## 2023-08-27 MED ORDER — AMOXICILLIN 875 MG PO TABS: 875.0000 mg | ORAL_TABLET | Freq: Two times a day (BID) | ORAL | 0 refills | Status: AC

## 2023-08-27 NOTE — Progress Notes (Signed)
 Virtual Visit via Video   I connected with patient on 08/27/23 at 11:20 AM EDT by a video enabled telemedicine application and verified that I am speaking with the correct person using two identifiers.  Location patient: Home Location provider: [facility], Office Persons participating in the virtual visit: Patient, Provider, CMA Kedric Passy)  I discussed the limitations of evaluation and management by telemedicine and the availability of in person appointments. The patient expressed understanding and agreed to proceed.  Subjective:   HPI:   Sinus pressure- sxs started Sunday w/ nasal congestion.  Having frontal and maxillary sinus pressure.  + HA.  No fever.  Denies ear pain/fullness.  No tooth pain.  No known sick contacts.  + cough.  + dizziness w/ bending forward.  + green nasal drainage.  Feels similar to previous infxns.  ROS:   See pertinent positives and negatives per HPI.  Patient Active Problem List   Diagnosis Date Noted   Anemia 04/14/2023   Menopausal and female climacteric states 04/14/2023   Prediabetes 04/14/2023   GERD (gastroesophageal reflux disease) 07/26/2021   HTN (hypertension) 04/11/2021   Vitamin D  deficiency 11/18/2016   Obesity (BMI 30.0-34.9) 11/18/2016   Anxiety 11/18/2016   Patellofemoral arthralgia of left knee 05/27/2013   Numerous moles 05/27/2013   Insomnia 12/04/2011   Physical exam, annual 10/22/2011    Social History   Tobacco Use   Smoking status: Never    Passive exposure: Current   Smokeless tobacco: Never  Substance Use Topics   Alcohol use: Yes    Comment: ocassionally    Current Outpatient Medications:    albuterol  (VENTOLIN  HFA) 108 (90 Base) MCG/ACT inhaler, INHALE 2 PUFFS BY MOUTH EVERY 4 HOURS AS NEEDED FOR COUGH, WHEEZING OR SHORTNESS OF BREATH, Disp: 18 g, Rfl: 1   budesonide -formoterol  (SYMBICORT ) 160-4.5 MCG/ACT inhaler, Inhale 2 puffs into the lungs 2 (two) times daily., Disp: 1 each, Rfl: 5   DYMISTA   137-50 MCG/ACT SUSP, Place 1 spray into the nose 2 (two) times daily., Disp: 23 g, Rfl: 5   hydrochlorothiazide  (HYDRODIURIL ) 12.5 MG tablet, Take 1 tablet by mouth once daily, Disp: 30 tablet, Rfl: 0   levocetirizine (XYZAL ) 5 MG tablet, Take 1 tablet (5 mg total) by mouth every evening., Disp: 90 tablet, Rfl: 15   meclizine  (ANTIVERT ) 25 MG tablet, Take 1 tablet (25 mg total) by mouth 3 (three) times daily as needed for dizziness., Disp: 30 tablet, Rfl: 0   montelukast  (SINGULAIR ) 10 MG tablet, Take 1 tablet (10 mg total) by mouth at bedtime., Disp: 90 tablet, Rfl: 1   Olopatadine-Mometasone (RYALTRIS ) 665-25 MCG/ACT SUSP, Place 2 sprays into the nose 2 (two) times daily as needed., Disp: 29 g, Rfl: 5   omeprazole  (PRILOSEC) 40 MG capsule, Take 1 capsule by mouth once daily, Disp: 30 capsule, Rfl: 0   sertraline  (ZOLOFT ) 100 MG tablet, Take 1 tablet (100 mg total) by mouth daily., Disp: 90 tablet, Rfl: 1   valACYclovir  (VALTREX ) 500 MG tablet, Take 1 tablet by mouth once daily, Disp: 90 tablet, Rfl: 0   Vitamin D , Ergocalciferol , (DRISDOL ) 1.25 MG (50000 UNIT) CAPS capsule, Take 1 capsule (50,000 Units total) by mouth every 7 (seven) days., Disp: 12 capsule, Rfl: 0   zolpidem  (AMBIEN ) 10 MG tablet, Take 1 tablet (10 mg total) by mouth at bedtime as needed. for sleep, Disp: 30 tablet, Rfl: 3   fluconazole  (DIFLUCAN ) 150 MG tablet, Take 150 mg by mouth once. (Patient not taking: Reported on  08/27/2023), Disp: , Rfl:    norethindrone (MICRONOR) 0.35 MG tablet, Take 1 tablet by mouth daily. (Patient not taking: Reported on 08/27/2023), Disp: , Rfl:   No Known Allergies  Objective:   Ht 5\' 5"  (1.651 m)   Wt 190 lb (86.2 kg)   BMI 31.62 kg/m  AAOx3, NAD NCAT, EOMI No obvious CN deficits Coloring WNL Pt is able to speak clearly, coherently without shortness of breath or increased work of breathing.  Thought process is linear.  Mood is appropriate.   Assessment and Plan:   Bacterial sinusitis-  new.  Pt's sxs consistent w/ prior infxns.  Start Amoxicillin  for sinus infxn and diflucan  in case abx cause yeast.  Pt expressed understanding and is in agreement w/ plan.    Laymon Priest, MD 08/27/2023

## 2023-09-13 ENCOUNTER — Other Ambulatory Visit: Payer: Self-pay | Admitting: Family Medicine

## 2023-09-25 DIAGNOSIS — Z6831 Body mass index (BMI) 31.0-31.9, adult: Secondary | ICD-10-CM | POA: Diagnosis not present

## 2023-09-25 DIAGNOSIS — K219 Gastro-esophageal reflux disease without esophagitis: Secondary | ICD-10-CM | POA: Diagnosis not present

## 2023-09-27 ENCOUNTER — Other Ambulatory Visit: Payer: Self-pay | Admitting: Family Medicine

## 2023-10-07 DIAGNOSIS — Z6831 Body mass index (BMI) 31.0-31.9, adult: Secondary | ICD-10-CM | POA: Diagnosis not present

## 2023-10-07 DIAGNOSIS — K219 Gastro-esophageal reflux disease without esophagitis: Secondary | ICD-10-CM | POA: Diagnosis not present

## 2023-10-07 DIAGNOSIS — R7303 Prediabetes: Secondary | ICD-10-CM | POA: Diagnosis not present

## 2023-10-08 ENCOUNTER — Other Ambulatory Visit: Payer: Self-pay | Admitting: Family Medicine

## 2023-10-15 DIAGNOSIS — D649 Anemia, unspecified: Secondary | ICD-10-CM | POA: Diagnosis not present

## 2023-10-15 DIAGNOSIS — R7303 Prediabetes: Secondary | ICD-10-CM | POA: Diagnosis not present

## 2023-10-15 DIAGNOSIS — N951 Menopausal and female climacteric states: Secondary | ICD-10-CM | POA: Diagnosis not present

## 2023-10-15 DIAGNOSIS — E559 Vitamin D deficiency, unspecified: Secondary | ICD-10-CM | POA: Diagnosis not present

## 2023-10-29 ENCOUNTER — Other Ambulatory Visit: Payer: Self-pay | Admitting: Family Medicine

## 2023-10-29 DIAGNOSIS — K219 Gastro-esophageal reflux disease without esophagitis: Secondary | ICD-10-CM | POA: Diagnosis not present

## 2023-10-29 DIAGNOSIS — R7303 Prediabetes: Secondary | ICD-10-CM | POA: Diagnosis not present

## 2023-10-29 DIAGNOSIS — D649 Anemia, unspecified: Secondary | ICD-10-CM | POA: Diagnosis not present

## 2023-10-29 DIAGNOSIS — E559 Vitamin D deficiency, unspecified: Secondary | ICD-10-CM | POA: Diagnosis not present

## 2023-10-29 DIAGNOSIS — N951 Menopausal and female climacteric states: Secondary | ICD-10-CM | POA: Diagnosis not present

## 2023-10-29 DIAGNOSIS — Z6831 Body mass index (BMI) 31.0-31.9, adult: Secondary | ICD-10-CM | POA: Diagnosis not present

## 2023-10-31 ENCOUNTER — Other Ambulatory Visit: Payer: Self-pay | Admitting: Allergy

## 2023-11-02 ENCOUNTER — Telehealth: Payer: Self-pay | Admitting: Allergy

## 2023-11-02 NOTE — Telephone Encounter (Signed)
 Pt request a call back, her Insurance has changed and won't cover symbicort 

## 2023-11-04 ENCOUNTER — Other Ambulatory Visit: Payer: Self-pay | Admitting: Allergy

## 2023-11-06 NOTE — Telephone Encounter (Signed)
 I called the patient to have her send her new insurance card to the [facility] office.  Patient is sending card through allergy  and [email or web address].  Please upload to chart so we can see what asthma medication can be sent in as an alternative for the Symbicort  inhaler.

## 2023-11-10 ENCOUNTER — Other Ambulatory Visit: Payer: Self-pay | Admitting: Medical Genetics

## 2023-11-10 ENCOUNTER — Encounter: Payer: Self-pay | Admitting: Allergy

## 2023-11-11 NOTE — Telephone Encounter (Signed)
 Entered the patients new insurance information into the system. Please proceed to see what medication can be sent in for her as an alternative for the Symbicort  inhaler.

## 2023-11-13 NOTE — Telephone Encounter (Signed)
 Dulera, Advair, Breo all seem to be covered please advise on which inhaler and dose you would like sent into for patient.

## 2023-11-16 MED ORDER — QVAR REDIHALER 80 MCG/ACT IN AERB: 2.0000 | INHALATION_SPRAY | Freq: Two times a day (BID) | RESPIRATORY_TRACT | 5 refills | Status: DC

## 2023-11-17 MED ORDER — MOMETASONE FURO-FORMOTEROL FUM 200-5 MCG/ACT IN AERO: INHALATION_SPRAY | RESPIRATORY_TRACT | 2 refills | Status: DC

## 2023-11-17 NOTE — Telephone Encounter (Signed)
Elwin SleightDulera was sent to the pharmacy

## 2023-11-17 NOTE — Addendum Note (Signed)
 Addended by: Isabellah Sobocinski E on: 11/17/2023 10:53 AM   Modules accepted: Orders

## 2023-11-26 ENCOUNTER — Other Ambulatory Visit: Payer: Self-pay | Admitting: Family Medicine

## 2023-11-26 DIAGNOSIS — Z6831 Body mass index (BMI) 31.0-31.9, adult: Secondary | ICD-10-CM | POA: Diagnosis not present

## 2023-11-26 DIAGNOSIS — K219 Gastro-esophageal reflux disease without esophagitis: Secondary | ICD-10-CM | POA: Diagnosis not present

## 2023-11-28 DIAGNOSIS — B3731 Acute candidiasis of vulva and vagina: Secondary | ICD-10-CM | POA: Diagnosis not present

## 2023-12-22 ENCOUNTER — Encounter: Payer: Self-pay | Admitting: Family Medicine

## 2023-12-22 ENCOUNTER — Other Ambulatory Visit: Payer: Self-pay

## 2023-12-22 NOTE — Telephone Encounter (Signed)
 Requested Prescriptions   Pending Prescriptions Disp Refills   zolpidem  (AMBIEN ) 10 MG tablet 30 tablet 3    Sig: Take 1 tablet (10 mg total) by mouth at bedtime as needed. for sleep     Date of patient request: 12/22/23 Last office visit: 07/08/23 Upcoming visit: sent message for patient to schedule follow up visit.  Date of last refill: 07/08/23 Last refill amount: 30 days with 3 refills

## 2023-12-23 DIAGNOSIS — D649 Anemia, unspecified: Secondary | ICD-10-CM | POA: Diagnosis not present

## 2023-12-23 DIAGNOSIS — Z6831 Body mass index (BMI) 31.0-31.9, adult: Secondary | ICD-10-CM | POA: Diagnosis not present

## 2023-12-23 MED ORDER — ZOLPIDEM TARTRATE 10 MG PO TABS: 10.0000 mg | ORAL_TABLET | Freq: Every evening | ORAL | 3 refills | Status: DC | PRN

## 2024-01-03 ENCOUNTER — Other Ambulatory Visit: Payer: Self-pay | Admitting: Family Medicine

## 2024-01-07 ENCOUNTER — Ambulatory Visit: Payer: Self-pay

## 2024-01-07 NOTE — Telephone Encounter (Signed)
 FYI Only or Action Required?: FYI only for provider.  Patient was last seen in primary care on 08/27/2023 by Mahlon Comer BRAVO, MD.  Called Nurse Triage reporting Pruritis.  Symptoms began several days ago.  Interventions attempted: OTC medications: Benadryl.  Symptoms are: unchanged.  Triage Disposition: Home Care  Patient/caregiver understands and will follow disposition?: Yes      Message from [facility] C sent at 01/07/2024  3:47 PM EDT  Reason for Triage: Patient called in stated her hands are itchy , and she wants to know what is going on,think it could be the  stated she tried benedrayl and not working would like for a nurse to callback with some medical advice   Reason for Disposition . Mild localized itching  Answer Assessment - Initial Assessment Questions Patient told to call back for worsening symptoms.   1. DESCRIPTION: Describe the itching you are having. Where is it located?     Irration on hands  2. SEVERITY: How bad is it?      Mild irritation  3. SCRATCHING: Are there any scratch marks? Bleeding?     Denies  4. ONSET: When did the itching begin? (e.g., minutes, hours, days ago)     Friday, Symptoms improved over the weekend but returned when she went back to work on Monday  5. CAUSE: What do you think is causing the itching?      Reaction to a soap she uses to wash her hands at work  6. OTHER SYMPTOMS: Do you have any other symptoms? (e.g., fever, rash)     Hands are warm to touch  Protocols used: Itching - Localized-A-AH

## 2024-01-07 NOTE — Telephone Encounter (Signed)
 Called patient to offer appt with provider. Left vm

## 2024-01-08 ENCOUNTER — Other Ambulatory Visit: Payer: Self-pay | Admitting: Medical Genetics

## 2024-01-08 DIAGNOSIS — Z006 Encounter for examination for normal comparison and control in clinical research program: Secondary | ICD-10-CM

## 2024-01-08 NOTE — Telephone Encounter (Signed)
 Patient notes she has had relief from itching when stopped using soap provided by work. Notes the itching is very mild now after changing soap. Made appt next week but will cancel if resolves

## 2024-01-08 NOTE — Telephone Encounter (Signed)
Appt made for Monday. °

## 2024-01-11 ENCOUNTER — Encounter: Payer: Self-pay | Admitting: Family Medicine

## 2024-01-11 ENCOUNTER — Telehealth: Payer: Self-pay

## 2024-01-11 ENCOUNTER — Telehealth: Admitting: Family Medicine

## 2024-01-11 DIAGNOSIS — Z9109 Other allergy status, other than to drugs and biological substances: Secondary | ICD-10-CM | POA: Diagnosis not present

## 2024-01-11 MED ORDER — TRIAMCINOLONE ACETONIDE 0.1 % EX OINT: 1.0000 | TOPICAL_OINTMENT | Freq: Two times a day (BID) | CUTANEOUS | 1 refills | Status: AC

## 2024-01-11 NOTE — Progress Notes (Signed)
 Virtual Visit via Video   I connected with patient on 01/11/24 at  2:40 PM EDT by a video enabled telemedicine application and verified that I am speaking with the correct person using two identifiers.  Location patient: Home Location provider: [facility], Office Persons participating in the virtual visit: Patient, Provider, CMA (Tonya H)  I discussed the limitations of evaluation and management by telemedicine and the availability of in person appointments. The patient expressed understanding and agreed to proceed.  Subjective:   HPI:   Red, itchy hands- pt has hx of sensitive skin and has to use a particular brand of soap at work.  Last week tried to avoid soap and was using benadryl and hydrocortisone cream and hands improved.  Today, only used soap 1 time and hands were back to burning, cracking, peeling.    ROS:   See pertinent positives and negatives per HPI.  Patient Active Problem List   Diagnosis Date Noted   Anemia 04/14/2023   Menopausal and female climacteric states 04/14/2023   Prediabetes 04/14/2023   GERD (gastroesophageal reflux disease) 07/26/2021   HTN (hypertension) 04/11/2021   Vitamin D  deficiency 11/18/2016   Obesity (BMI 30.0-34.9) 11/18/2016   Anxiety 11/18/2016   Patellofemoral arthralgia of left knee 05/27/2013   Numerous moles 05/27/2013   Insomnia 12/04/2011   Physical exam, annual 10/22/2011    Social History   Tobacco Use   Smoking status: Never    Passive exposure: Current   Smokeless tobacco: Never  Substance Use Topics   Alcohol use: Yes    Comment: ocassionally    Current Outpatient Medications:    albuterol  (VENTOLIN  HFA) 108 (90 Base) MCG/ACT inhaler, INHALE 2 PUFFS BY MOUTH EVERY 4 HOURS AS NEEDED FOR COUGH, WHEEZING OR SHORTNESS OF BREATH, Disp: 18 g, Rfl: 1   beclomethasone (QVAR  REDIHALER) 80 MCG/ACT inhaler, Inhale 2 puffs into the lungs 2 (two) times daily., Disp: 10.6 g, Rfl: 5   DYMISTA  137-50 MCG/ACT SUSP,  Place 1 spray into the nose 2 (two) times daily., Disp: 23 g, Rfl: 5   hydrochlorothiazide  (HYDRODIURIL ) 12.5 MG tablet, Take 1 tablet by mouth once daily, Disp: 90 tablet, Rfl: 0   levocetirizine (XYZAL ) 5 MG tablet, Take 1 tablet (5 mg total) by mouth every evening., Disp: 90 tablet, Rfl: 15   meclizine  (ANTIVERT ) 25 MG tablet, Take 1 tablet (25 mg total) by mouth 3 (three) times daily as needed for dizziness., Disp: 30 tablet, Rfl: 0   mometasone -formoterol  (DULERA) 200-5 MCG/ACT AERO, Inhale 2 puffs into the lungs twice a day with spacer., Disp: 1 each, Rfl: 2   montelukast  (SINGULAIR ) 10 MG tablet, Take 1 tablet (10 mg total) by mouth at bedtime., Disp: 90 tablet, Rfl: 1   norethindrone (MICRONOR) 0.35 MG tablet, Take 1 tablet by mouth daily., Disp: , Rfl:    Olopatadine-Mometasone  (RYALTRIS ) 665-25 MCG/ACT SUSP, Place 2 sprays into the nose 2 (two) times daily as needed., Disp: 29 g, Rfl: 5   omeprazole  (PRILOSEC) 40 MG capsule, Take 1 capsule by mouth once daily, Disp: 30 capsule, Rfl: 5   sertraline  (ZOLOFT ) 100 MG tablet, Take 1 tablet (100 mg total) by mouth daily., Disp: 90 tablet, Rfl: 1   valACYclovir  (VALTREX ) 500 MG tablet, Take 1 tablet by mouth once daily, Disp: 90 tablet, Rfl: 0   Vitamin D , Ergocalciferol , (DRISDOL ) 1.25 MG (50000 UNIT) CAPS capsule, Take 1 capsule (50,000 Units total) by mouth every 7 (seven) days., Disp: 12 capsule, Rfl: 0  zolpidem  (AMBIEN ) 10 MG tablet, Take 1 tablet (10 mg total) by mouth at bedtime as needed. for sleep, Disp: 30 tablet, Rfl: 3  No Known Allergies  Objective:   There were no vitals taken for this visit. AAOx3, NAD NCAT, EOMI No obvious CN deficits Coloring WNL Pt is able to speak clearly, coherently without shortness of breath or increased work of breathing.  Thought process is linear.  Mood is appropriate.   Assessment and Plan:   Soap allergy /Contact Dermatitis- new.  Stressed importance of avoidance.  Letter provided to allow  her to use her own soap.  Triamcinolone ointment to use prn.  Pt expressed understanding and is in agreement w/ plan.    Comer Greet, MD 01/11/2024

## 2024-01-11 NOTE — Telephone Encounter (Signed)
 Copied from CRM [id number]. Topic: Appointments - Appointment Info/Confirmation >> Jan 11, 2024  9:07 AM Aleatha C wrote: Patient wants to know if she still should come in because her hands are better and she has only being using soap at home

## 2024-01-20 DIAGNOSIS — E559 Vitamin D deficiency, unspecified: Secondary | ICD-10-CM | POA: Diagnosis not present

## 2024-01-20 DIAGNOSIS — D649 Anemia, unspecified: Secondary | ICD-10-CM | POA: Diagnosis not present

## 2024-01-20 DIAGNOSIS — Z6830 Body mass index (BMI) 30.0-30.9, adult: Secondary | ICD-10-CM | POA: Diagnosis not present

## 2024-02-02 LAB — GENECONNECT MOLECULAR SCREEN: Genetic Analysis Overall Interpretation: NEGATIVE

## 2024-02-02 NOTE — Patient Instructions (Incomplete)
 Allergic rhinitis with conjunctivitis-moderately controlled - Continue avoidance measures for grasses, ragweed, weeds, outdoor molds, cat, and dog. -Verbally discussed that I do not feel like she needs an antibiotic at this time.  Please call our office and let me know if you are not getting better. - Continue:   Xyzal  (levocetirizine) 5mg  tablet daily. Singulair  10mg  tablet daily.  Dymista  1 spray per nostril 2 times daily for nasal congestion or drainage.   Pataday (olopatadine) one drop per eye daily IF NEEDED for itchy/watery eyes.   - You can use an extra dose of the antihistamine, if needed, for breakthrough symptoms.  - Consider nasal saline rinses 1-2 times daily to remove allergens from the nasal cavities as well as help with mucous clearance (this is especially helpful to do before the nasal sprays are given) - Consider allergy  shots as a means of long-term control if medication management is not effective enough. - Allergy  shots re-train and reset the immune system to ignore environmental allergens and decrease the resulting immune response to those allergens (sneezing, itchy watery eyes, runny nose, nasal congestion, etc).    - Allergy  shots improve symptoms in 75-85% of patients.  - CPT codes given to call insurance and find out cost.  Discussed traditional immunotherapy versus Rush immunotherapy.  Please let us  know which version of immunotherapy you are interested in starting  Reactive airway-controlled - Daily controller medication(s):  Continue Qvar  80 mcg 2 puffs twice a day.  Rinse mouth after use.  - Prior to physical activity: albuterol  2 puffs 10-15 minutes before physical activity. - Rescue medications: albuterol  2-4 puffs every 4-6 hours as needed  - Asthma control goals:  * Full participation in all desired activities (may need albuterol  before activity) * Albuterol  use two time or less a week on average (not counting use with activity) * Cough interfering with  sleep two time or less a month * Oral steroids no more than once a year * No hospitalizations  Follow-up in 3 months or sooner if needed

## 2024-02-03 ENCOUNTER — Ambulatory Visit (INDEPENDENT_AMBULATORY_CARE_PROVIDER_SITE_OTHER): Admitting: Family

## 2024-02-03 ENCOUNTER — Other Ambulatory Visit: Payer: Self-pay

## 2024-02-03 ENCOUNTER — Encounter: Payer: Self-pay | Admitting: Family

## 2024-02-03 VITALS — BP 138/88 | HR 96 | Temp 98.3°F

## 2024-02-03 DIAGNOSIS — J4530 Mild persistent asthma, uncomplicated: Secondary | ICD-10-CM

## 2024-02-03 DIAGNOSIS — H1013 Acute atopic conjunctivitis, bilateral: Secondary | ICD-10-CM | POA: Diagnosis not present

## 2024-02-03 DIAGNOSIS — J3089 Other allergic rhinitis: Secondary | ICD-10-CM | POA: Diagnosis not present

## 2024-02-03 NOTE — Progress Notes (Signed)
 [address] [place] [address] Dept: [phone]  FOLLOW UP NOTE  Patient ID: Tracy Burch, female    DOB: 1980/07/09  Age: 43 y.o. MRN: [id number] Date of Office Visit: 02/03/2024  Assessment  Chief Complaint: Follow-up (Asthma/allergies) and Nasal Congestion (Sniffing with pain xx 3 days)  HPI Tracy Burch is a 43 year old female who presents today for follow-up of allergic rhinitis with conjunctivitis and reactive airway.  She was last seen on Aug 13, 2023 by Dr. Jeneal.  She denies any new diagnosis or surgery since her last office visit.  Allergic rhinitis with conjunctivitis: She reports since Sunday whenever she sniffs from the right side of her nose her forehead will hurt.  She reports nasal congestion and at times has green rhinorrhea.  She also has itchy stopped up ears.  She denies fever, chills, body aches, postnasal drip, and sore throat.  She was treated for 1 sinus infection in July.  She continues to take Xyzal  5 mg daily, Singulair  10 mg daily, and Dymista  1 spray each nostril twice a day.  She does not use any saline rinses.  She is potentially interested in allergy  injections depending on the cost.  She feels like her allergies are year-round, but are worse in the spring.  Reactive airway.  She is currently using Qvar  80 mcg 2 puffs twice a day.  Dossie was too expensive.  She reports wheezing at times.  She will use her albuterol  and it helps.  She denies cough, tightness in chest, shortness of breath, and nocturnal awakenings due to breathing problems.  Since her last office visit she has not required any systemic steroids or made any trips to the emergency room or urgent care due to breathing problems.  She uses her albuterol  infrequently.  It is maybe once a month that she uses her albuterol .   Drug Allergies:  No Known Allergies  Review of Systems: Negative except as per HPI  Physical Exam: BP 138/88   Pulse 96   Temp 98.3 F (36.8 C)    SpO2 99%    Physical Exam Constitutional:      Appearance: Normal appearance.  HENT:     Head: Normocephalic and atraumatic.     Comments: Pharynx normal, eyes normal, ears normal, nose: Bilateral lower turbinates moderately edematous and slightly erythematous with no drainage noted    Right Ear: Tympanic membrane, ear canal and external ear normal.     Left Ear: Tympanic membrane, ear canal and external ear normal.     Mouth/Throat:     Mouth: Mucous membranes are moist.     Pharynx: Oropharynx is clear.  Eyes:     Conjunctiva/sclera: Conjunctivae normal.  Cardiovascular:     Rate and Rhythm: Regular rhythm.     Heart sounds: Normal heart sounds.  Pulmonary:     Effort: Pulmonary effort is normal.     Breath sounds: Normal breath sounds.     Comments: Lungs clear to auscultation Musculoskeletal:     Cervical back: Neck supple.  Skin:    General: Skin is warm.  Neurological:     Mental Status: She is alert and oriented to person, place, and time.  Psychiatric:        Mood and Affect: Mood normal.        Behavior: Behavior normal.        Thought Content: Thought content normal.        Judgment: Judgment normal.     Diagnostics: FVC  3.28 L (97%), FEV1 2.83 L (103%), FEV1/FVC 0.86.  Spirometry indicates normal spirometry.  Assessment and Plan: 1. Non-seasonal allergic rhinitis due to other allergic trigger   2. Mild persistent reactive airway disease without complication   3. Allergic conjunctivitis of both eyes     No orders of the defined types were placed in this encounter.   Patient Instructions  Allergic rhinitis with conjunctivitis-moderately controlled - Continue avoidance measures for grasses, ragweed, weeds, outdoor molds, cat, and dog. -Verbally discussed that I do not feel like she needs an antibiotic at this time.  Please call our office and let me know if you are not getting better. - Continue:   Xyzal  (levocetirizine) 5mg  tablet daily. Singulair  10mg   tablet daily.  Dymista  1 spray per nostril 2 times daily for nasal congestion or drainage.   Pataday (olopatadine) one drop per eye daily IF NEEDED for itchy/watery eyes.   - You can use an extra dose of the antihistamine, if needed, for breakthrough symptoms.  - Consider nasal saline rinses 1-2 times daily to remove allergens from the nasal cavities as well as help with mucous clearance (this is especially helpful to do before the nasal sprays are given) - Consider allergy  shots as a means of long-term control if medication management is not effective enough. - Allergy  shots re-train and reset the immune system to ignore environmental allergens and decrease the resulting immune response to those allergens (sneezing, itchy watery eyes, runny nose, nasal congestion, etc).    - Allergy  shots improve symptoms in 75-85% of patients.  - CPT codes given to call insurance and find out cost.  Discussed traditional immunotherapy versus Rush immunotherapy.  Please let us  know which version of immunotherapy you are interested in starting  Reactive airway-controlled - Daily controller medication(s):  Continue Qvar  80 mcg 2 puffs twice a day.  Rinse mouth after use.  - Prior to physical activity: albuterol  2 puffs 10-15 minutes before physical activity. - Rescue medications: albuterol  2-4 puffs every 4-6 hours as needed  - Asthma control goals:  * Full participation in all desired activities (may need albuterol  before activity) * Albuterol  use two time or less a week on average (not counting use with activity) * Cough interfering with sleep two time or less a month * Oral steroids no more than once a year * No hospitalizations  Follow-up in 3 months or sooner if needed  Return in about 3 months (around 05/05/2024), or if symptoms worsen or fail to improve.    Thank you for the opportunity to care for this patient.  Please do not hesitate to contact me with questions.  Wanda Craze, FNP [facility] 

## 2024-02-03 NOTE — Addendum Note (Signed)
 Addended by: NANCEE JON SAILOR on: 02/03/2024 05:07 PM   Modules accepted: Orders

## 2024-02-04 ENCOUNTER — Telehealth: Payer: Self-pay

## 2024-02-04 MED ORDER — EPINEPHRINE 0.3 MG/0.3ML IJ SOAJ: 0.3000 mg | INTRAMUSCULAR | 1 refills | Status: AC | PRN

## 2024-02-04 NOTE — Addendum Note (Signed)
 Addended by: Najae Filsaime on: 02/04/2024 02:30 PM   Modules accepted: Orders

## 2024-02-04 NOTE — Telephone Encounter (Signed)
 Dr. Jeneal can you please write a vaccine script for Tracy Burch to start allergy  injections.  Please let Benigna know that I have sent a prescription for an epinephrine auto injector device.

## 2024-02-04 NOTE — Telephone Encounter (Signed)
 Patient was seen yesterday in clinic. Was informed to call us  back when she decides what route of immunotherapy she wanted to go. She states that she wants to do the regular allergy  injections. She would like to start at the end of December. Informed her that we are on hold with starting sooner than December 10th

## 2024-02-05 ENCOUNTER — Encounter: Payer: Self-pay | Admitting: Allergy

## 2024-02-07 DIAGNOSIS — J029 Acute pharyngitis, unspecified: Secondary | ICD-10-CM | POA: Diagnosis not present

## 2024-02-07 DIAGNOSIS — J0110 Acute frontal sinusitis, unspecified: Secondary | ICD-10-CM | POA: Diagnosis not present

## 2024-02-08 ENCOUNTER — Other Ambulatory Visit: Payer: Self-pay | Admitting: Family

## 2024-02-08 ENCOUNTER — Telehealth: Payer: Self-pay | Admitting: *Deleted

## 2024-02-08 MED ORDER — AMOXICILLIN-POT CLAVULANATE 875-125 MG PO TABS: 1.0000 | ORAL_TABLET | Freq: Two times a day (BID) | ORAL | 0 refills | Status: DC

## 2024-02-08 NOTE — Telephone Encounter (Signed)
 Spoke with Janyiah and prescription for Augmentin sent.

## 2024-02-08 NOTE — Telephone Encounter (Signed)
 Spoke with pt and she said urgent care only told her to take afrin. She is not allergic to PCN. Using Flonase winferd and Xzyal and Montelukast . She is having sinus headaches and sinus pain and blowing out green mucus. Pharmacy is [facility] [address].

## 2024-02-08 NOTE — Progress Notes (Signed)
 Prescription sent for Augmentin 875 mg taking 1 tablet twice a day for 7 days. She reports that she is not pregnant and has an IUD. Instructed her to let us  know if she is not getting better.  Sheneka Schrom,FNP [facility]

## 2024-02-08 NOTE — Telephone Encounter (Signed)
 Can you call and see if she is allergic to penicillin and send the message back to me. It does not appear on our end that she has any drug allergies

## 2024-02-09 ENCOUNTER — Other Ambulatory Visit: Payer: Self-pay | Admitting: Allergy

## 2024-02-09 NOTE — Telephone Encounter (Signed)
 Ok. Thank you.

## 2024-02-24 DIAGNOSIS — D649 Anemia, unspecified: Secondary | ICD-10-CM | POA: Diagnosis not present

## 2024-02-24 DIAGNOSIS — Z6830 Body mass index (BMI) 30.0-30.9, adult: Secondary | ICD-10-CM | POA: Diagnosis not present

## 2024-03-01 ENCOUNTER — Other Ambulatory Visit: Payer: Self-pay | Admitting: Allergy

## 2024-03-01 DIAGNOSIS — H1013 Acute atopic conjunctivitis, bilateral: Secondary | ICD-10-CM

## 2024-03-01 DIAGNOSIS — J3089 Other allergic rhinitis: Secondary | ICD-10-CM

## 2024-03-02 DIAGNOSIS — J301 Allergic rhinitis due to pollen: Secondary | ICD-10-CM | POA: Diagnosis not present

## 2024-03-02 DIAGNOSIS — J302 Other seasonal allergic rhinitis: Secondary | ICD-10-CM | POA: Diagnosis not present

## 2024-03-02 DIAGNOSIS — J3081 Allergic rhinitis due to animal (cat) (dog) hair and dander: Secondary | ICD-10-CM | POA: Diagnosis not present

## 2024-03-02 NOTE — Progress Notes (Signed)
 Aeroallergen Immunotherapy  Ordering Provider: Danita Brain, MD  Patient Details Name: Tracy Burch MRN: [id number] Date of Birth: 09-09-1980  Order 1 of 2  Vial Label: pollen, pet  0.3 ml (Volume)  BAU Concentration -- 7 Grass Mix* 100,000 ([place]  Blue, [place], Orchard, Perennial Rye, RedTop, Sweet Vernal, Timothy) 0.3 ml (Volume)  BAU Concentration -- [place] 10,000 0.2 ml (Volume)  1:20 Concentration -- Johnson 0.3 ml (Volume)  1:20 Concentration -- Ragweed Mix 0.5 ml (Volume)  1:20 Concentration -- Weed Mix* 0.5 ml (Volume)  1:10 Concentration -- Cat Hair 0.5 ml (Volume)  1:10 Concentration -- Dog Epithelia   2.6  ml Extract Subtotal 2.4  ml Diluent 5.0  ml Maintenance Total  Schedule:  B Green Vial (1:1,000): Schedule B (6 doses) Blue Vial (1:100): Schedule B (6 doses) Yellow Vial (1:10): Schedule B (6 doses) Red Vial (1:1): Schedule A (14 doses)  Special Instructions: After completion of the first Red Vial, please space to every two weeks. After completion of the second Red Vial, please space to every 4 weeks. Ok to up dose new vials on Schedule D. Ok to come twice weekly, if desired, as long as there is 48 hours between injections.

## 2024-03-02 NOTE — Progress Notes (Signed)
 VIALS MADE ON 03/02/24

## 2024-03-02 NOTE — Progress Notes (Signed)
 Aeroallergen Immunotherapy  Ordering Provider: Danita Brain, MD  Patient Details Name: Ladean Steinmeyer MRN: [id number] Date of Birth: April 12, 1980  Order 2 of 2  Vial Label: mold  0.2 ml (Volume)  1:20 Concentration -- Alternaria alternata 0.2 ml (Volume)  1:20 Concentration -- Cladosporium herbarum 0.2 ml (Volume)  1:10 Concentration -- Fusarium moniliforme   0.6  ml Extract Subtotal 4.4  ml Diluent 5.0  ml Maintenance Total  Schedule:  B Green Vial (1:1,000): Schedule B (6 doses) Blue Vial (1:100): Schedule B (6 doses) Yellow Vial (1:10): Schedule B (6 doses) Red Vial (1:1): Schedule A (14 doses)  Special Instructions: After completion of the first Red Vial, please space to every two weeks. After completion of the second Red Vial, please space to every 4 weeks. Ok to up dose new vials on Schedule D. Ok to come twice weekly, if desired, as long as there is 48 hours between injections.

## 2024-03-12 ENCOUNTER — Other Ambulatory Visit: Payer: Self-pay | Admitting: Allergy

## 2024-03-28 ENCOUNTER — Ambulatory Visit

## 2024-03-30 ENCOUNTER — Other Ambulatory Visit: Payer: Self-pay | Admitting: Family Medicine

## 2024-04-03 ENCOUNTER — Other Ambulatory Visit: Payer: Self-pay | Admitting: Family Medicine

## 2024-04-07 ENCOUNTER — Encounter: Payer: Self-pay | Admitting: Family Medicine

## 2024-04-08 MED ORDER — VALACYCLOVIR HCL 500 MG PO TABS: 500.0000 mg | ORAL_TABLET | Freq: Every day | ORAL | 0 refills | Status: AC

## 2024-04-08 MED ORDER — SERTRALINE HCL 100 MG PO TABS: 100.0000 mg | ORAL_TABLET | Freq: Every day | ORAL | 0 refills | Status: AC

## 2024-04-08 MED ORDER — HYDROCHLOROTHIAZIDE 12.5 MG PO TABS: 12.5000 mg | ORAL_TABLET | Freq: Every day | ORAL | 0 refills | Status: AC

## 2024-04-08 MED ORDER — OMEPRAZOLE 40 MG PO CPDR: 40.0000 mg | DELAYED_RELEASE_CAPSULE | Freq: Every day | ORAL | 5 refills | Status: AC

## 2024-04-09 ENCOUNTER — Encounter: Payer: Self-pay | Admitting: Allergy

## 2024-04-11 ENCOUNTER — Other Ambulatory Visit: Payer: Self-pay

## 2024-04-11 DIAGNOSIS — J4530 Mild persistent asthma, uncomplicated: Secondary | ICD-10-CM

## 2024-04-11 DIAGNOSIS — H1013 Acute atopic conjunctivitis, bilateral: Secondary | ICD-10-CM

## 2024-04-11 DIAGNOSIS — J3089 Other allergic rhinitis: Secondary | ICD-10-CM

## 2024-04-11 MED ORDER — ALBUTEROL SULFATE HFA 108 (90 BASE) MCG/ACT IN AERS: INHALATION_SPRAY | RESPIRATORY_TRACT | 1 refills | Status: AC

## 2024-04-11 MED ORDER — MONTELUKAST SODIUM 10 MG PO TABS: 10.0000 mg | ORAL_TABLET | Freq: Every day | ORAL | 0 refills | Status: AC

## 2024-04-11 MED ORDER — DYMISTA 137-50 MCG/ACT NA SUSP: 1.0000 | Freq: Two times a day (BID) | NASAL | 5 refills | Status: AC

## 2024-04-11 MED ORDER — LEVOCETIRIZINE DIHYDROCHLORIDE 5 MG PO TABS: 5.0000 mg | ORAL_TABLET | Freq: Every evening | ORAL | 15 refills | Status: AC

## 2024-04-11 MED ORDER — QVAR REDIHALER 80 MCG/ACT IN AERB: 2.0000 | INHALATION_SPRAY | Freq: Two times a day (BID) | RESPIRATORY_TRACT | 5 refills | Status: AC

## 2024-04-11 MED ORDER — OLOPATADINE HCL 0.2 % OP SOLN: OPHTHALMIC | 3 refills | Status: AC

## 2024-04-14 ENCOUNTER — Ambulatory Visit

## 2024-04-15 ENCOUNTER — Ambulatory Visit: Payer: Medicaid Other | Admitting: Family Medicine

## 2024-04-15 ENCOUNTER — Encounter: Payer: Self-pay | Admitting: Family Medicine

## 2024-04-15 VITALS — BP 118/84 | HR 70 | Temp 97.7°F | Ht 68.0 in | Wt 192.2 lb

## 2024-04-15 DIAGNOSIS — E66811 Obesity, class 1: Secondary | ICD-10-CM

## 2024-04-15 DIAGNOSIS — Z23 Encounter for immunization: Secondary | ICD-10-CM

## 2024-04-15 DIAGNOSIS — Z1231 Encounter for screening mammogram for malignant neoplasm of breast: Secondary | ICD-10-CM

## 2024-04-15 DIAGNOSIS — Z0000 Encounter for general adult medical examination without abnormal findings: Secondary | ICD-10-CM | POA: Diagnosis not present

## 2024-04-15 DIAGNOSIS — E559 Vitamin D deficiency, unspecified: Secondary | ICD-10-CM

## 2024-04-15 LAB — CBC WITH DIFFERENTIAL/PLATELET
Basophils Absolute: 0.1 K/uL (ref 0.0–0.1)
Basophils Relative: 1 % (ref 0.0–3.0)
Eosinophils Absolute: 0.4 K/uL (ref 0.0–0.7)
Eosinophils Relative: 5.5 % — ABNORMAL HIGH (ref 0.0–5.0)
HCT: 33.4 % — ABNORMAL LOW (ref 36.0–46.0)
Hemoglobin: 10.8 g/dL — ABNORMAL LOW (ref 12.0–15.0)
Lymphocytes Relative: 38.2 % (ref 12.0–46.0)
Lymphs Abs: 3 K/uL (ref 0.7–4.0)
MCHC: 32.4 g/dL (ref 30.0–36.0)
MCV: 81.4 fl (ref 78.0–100.0)
Monocytes Absolute: 0.4 K/uL (ref 0.1–1.0)
Monocytes Relative: 4.7 % (ref 3.0–12.0)
Neutro Abs: 4 K/uL (ref 1.4–7.7)
Neutrophils Relative %: 50.6 % (ref 43.0–77.0)
Platelets: 383 K/uL (ref 150.0–400.0)
RBC: 4.1 Mil/uL (ref 3.87–5.11)
RDW: 17.9 % — ABNORMAL HIGH (ref 11.5–15.5)
WBC: 8 K/uL (ref 4.0–10.5)

## 2024-04-15 LAB — LIPID PANEL
Cholesterol: 175 mg/dL (ref 28–200)
HDL: 67.8 mg/dL
LDL Cholesterol: 94 mg/dL (ref 10–99)
NonHDL: 106.94
Total CHOL/HDL Ratio: 3
Triglycerides: 63 mg/dL (ref 10.0–149.0)
VLDL: 12.6 mg/dL (ref 0.0–40.0)

## 2024-04-15 LAB — BASIC METABOLIC PANEL WITH GFR
BUN: 10 mg/dL (ref 6–23)
CO2: 29 meq/L (ref 19–32)
Calcium: 9.2 mg/dL (ref 8.4–10.5)
Chloride: 102 meq/L (ref 96–112)
Creatinine, Ser: 0.71 mg/dL (ref 0.40–1.20)
GFR: 105.14 mL/min
Glucose, Bld: 89 mg/dL (ref 70–99)
Potassium: 4 meq/L (ref 3.5–5.1)
Sodium: 137 meq/L (ref 135–145)

## 2024-04-15 LAB — HEPATIC FUNCTION PANEL
ALT: 7 U/L (ref 3–35)
AST: 11 U/L (ref 5–37)
Albumin: 3.7 g/dL (ref 3.5–5.2)
Alkaline Phosphatase: 78 U/L (ref 39–117)
Bilirubin, Direct: 0 mg/dL — ABNORMAL LOW (ref 0.1–0.3)
Total Bilirubin: 0.2 mg/dL (ref 0.2–1.2)
Total Protein: 7.3 g/dL (ref 6.0–8.3)

## 2024-04-15 LAB — VITAMIN D 25 HYDROXY (VIT D DEFICIENCY, FRACTURES): VITD: 34.05 ng/mL (ref 30.00–100.00)

## 2024-04-15 LAB — TSH: TSH: 1.47 u[IU]/mL (ref 0.35–5.50)

## 2024-04-15 MED ORDER — WEGOVY 0.25 MG/0.5ML ~~LOC~~ SOAJ: 0.2500 mg | SUBCUTANEOUS | 1 refills | Status: DC

## 2024-04-15 MED ORDER — ZOLPIDEM TARTRATE 10 MG PO TABS: 10.0000 mg | ORAL_TABLET | Freq: Every evening | ORAL | 3 refills | Status: AC | PRN

## 2024-04-15 NOTE — Assessment & Plan Note (Signed)
 Pt's PE WNL w/ exception of BMI.  UTD on mammo, pap, Tdap, flu.  Prevnar given tdoay due to asthma.  Check labs.  Anticipatory guidance provided.

## 2024-04-15 NOTE — Patient Instructions (Addendum)
 Follow up in 6 months to recheck weight loss We'll notify you of your lab results and make any changes if needed Keep up the good work on healthy diet and regular exercise- you look great! START the Wegovy  at 0.25mg  weekly after 4 weeks, we can either increase the dose or stay where we are depending on how you feel and your weight loss Call GYN and let them know about the night sweats Call with any questions or concerns Stay Safe!  Stay Healthy! Happy New Year!!

## 2024-04-15 NOTE — Progress Notes (Signed)
" ° °  Subjective:    Patient ID: Tracy Burch, female    DOB: 1980-10-28, 44 y.o.   MRN: [id number]  HPI CPE- UTD on mammo, pap, Tdap, flu  Health Maintenance  Topic Date Due   Pneumococcal Vaccine (1 of 2 - PCV) Never done   Hepatitis B Vaccines 19-59 Average Risk (1 of 3 - 19+ 3-dose series) Never done   COVID-19 Vaccine (7 - Mixed Product risk 2025-26 season) 08/03/2024   Mammogram  06/17/2025   Cervical Cancer Screening (HPV/Pap Cotest)  05/20/2027   DTaP/Tdap/Td (3 - Td or Tdap) 04/14/2033   Influenza Vaccine  Completed   HPV VACCINES (No Doses Required) Completed   Hepatitis C Screening  Completed   HIV Screening  Completed   Meningococcal B Vaccine  Aged Out     Patient Care Team    Relationship Specialty Notifications Start End  Mahlon Comer BRAVO, MD PCP - General Family Medicine  11/28/22   Rutherford Gain, MD Consulting Physician Obstetrics and Gynecology  04/15/23       Review of Systems Patient reports no vision/ hearing changes, adenopathy,fever, weight change,  persistant/recurrent hoarseness , swallowing issues, chest pain, palpitations, edema, persistant/recurrent cough, hemoptysis, dyspnea (rest/exertional/paroxysmal nocturnal), gastrointestinal bleeding (melena, rectal bleeding), abdominal pain, significant heartburn, bowel changes, GU symptoms (dysuria, hematuria, incontinence), Gyn symptoms (abnormal  bleeding, pain),  syncope, focal weakness, memory loss, numbness & tingling, skin/hair/nail changes, abnormal bruising or bleeding, anxiety, or depression.     Objective:   Physical Exam General Appearance:    Alert, cooperative, no distress, appears stated age  Head:    Normocephalic, without obvious abnormality, atraumatic  Eyes:    PERRL, conjunctiva/corneas clear, EOM's intact both eyes  Ears:    Normal TM's and external ear canals, both ears  Nose:   Nares normal, septum midline, mucosa normal, no drainage    or sinus tenderness  Throat:   Lips,  mucosa, and tongue normal; teeth and gums normal  Neck:   Supple, symmetrical, trachea midline, no adenopathy;    Thyroid : no enlargement/tenderness/nodules  Back:     Symmetric, no curvature, ROM normal, no CVA tenderness  Lungs:     Clear to auscultation bilaterally, respirations unlabored  Chest Wall:    No tenderness or deformity   Heart:    Regular rate and rhythm, S1 and S2 normal, no murmur, rub   or gallop  Breast Exam:    Deferred to GYN  Abdomen:     Soft, non-tender, bowel sounds active all four quadrants,    no masses, no organomegaly  Genitalia:    Deferred to GYN  Rectal:    Extremities:   Extremities normal, atraumatic, no cyanosis or edema  Pulses:   2+ and symmetric all extremities  Skin:   Skin color, texture, turgor normal, no rashes or lesions  Lymph nodes:   Cervical, supraclavicular, and axillary nodes normal  Neurologic:   CNII-XII intact, normal strength, sensation and reflexes    throughout          Assessment & Plan:    "

## 2024-04-15 NOTE — Assessment & Plan Note (Signed)
 BMI is actually down to 29.22.  She is going to the gym, has changed her diet, is drinking more water, but would like to lose some weight.  Since her BMI is 29.22 and she has a comorbidity (HTN) she does meet criteria for GLP1 weight loss.  Prescription for Wegovy  sent to pharmacy.  Pt expressed understanding and is in agreement w/ plan.

## 2024-04-18 MED ORDER — WEGOVY 0.25 MG/0.5ML ~~LOC~~ SOAJ: 0.2500 mg | SUBCUTANEOUS | 1 refills | Status: AC

## 2024-04-18 NOTE — Addendum Note (Signed)
 Addended by: Allexis Bordenave A on: 04/18/2024 04:07 PM   Modules accepted: Orders

## 2024-04-19 ENCOUNTER — Other Ambulatory Visit (HOSPITAL_COMMUNITY): Payer: Self-pay

## 2024-04-19 ENCOUNTER — Telehealth: Payer: Self-pay

## 2024-04-19 ENCOUNTER — Ambulatory Visit: Payer: Self-pay | Admitting: Family Medicine

## 2024-04-19 NOTE — Telephone Encounter (Signed)
*  AA  Pharmacy Patient Advocate Encounter   Received notification from Fax that prior authorization for Dymista  137-50MCG/ACT suspension   is required/requested.   Insurance verification completed.   The patient is insured through [facility].   Per test claim: The current 30 day co-pay is, $4.00.  No PA needed at this time. This test claim was processed through [facility]- copay amounts may vary at other pharmacies due to pharmacy/plan contracts, or as the patient moves through the different stages of their insurance plan.     Key: [id number]

## 2024-04-21 ENCOUNTER — Telehealth: Payer: Self-pay | Admitting: Allergy

## 2024-04-21 NOTE — Telephone Encounter (Signed)
 Pt is stating that she is coughing a lot and she is using both inhalers and still not helping .Tracy Burch It only happens when she is talking that the coughing will start

## 2024-04-21 NOTE — Telephone Encounter (Signed)
 err

## 2024-04-21 NOTE — Telephone Encounter (Signed)
 Please have her schedule an appointment.  I have openings in [employer] today if interested

## 2024-04-22 ENCOUNTER — Other Ambulatory Visit: Payer: Self-pay | Admitting: Internal Medicine

## 2024-04-22 ENCOUNTER — Encounter: Payer: Self-pay | Admitting: Internal Medicine

## 2024-04-22 ENCOUNTER — Other Ambulatory Visit: Payer: Self-pay

## 2024-04-22 ENCOUNTER — Ambulatory Visit: Admitting: Internal Medicine

## 2024-04-22 VITALS — BP 110/80 | HR 98 | Temp 98.1°F

## 2024-04-22 DIAGNOSIS — J4530 Mild persistent asthma, uncomplicated: Secondary | ICD-10-CM | POA: Diagnosis not present

## 2024-04-22 DIAGNOSIS — K219 Gastro-esophageal reflux disease without esophagitis: Secondary | ICD-10-CM

## 2024-04-22 DIAGNOSIS — J3089 Other allergic rhinitis: Secondary | ICD-10-CM

## 2024-04-22 DIAGNOSIS — J302 Other seasonal allergic rhinitis: Secondary | ICD-10-CM

## 2024-04-22 MED ORDER — BUDESONIDE-FORMOTEROL FUMARATE 80-4.5 MCG/ACT IN AERO: 2.0000 | INHALATION_SPRAY | Freq: Two times a day (BID) | RESPIRATORY_TRACT | 5 refills | Status: DC

## 2024-04-22 MED ORDER — OMEPRAZOLE 40 MG PO CPDR: 40.0000 mg | DELAYED_RELEASE_CAPSULE | Freq: Two times a day (BID) | ORAL | 0 refills | Status: DC

## 2024-04-22 NOTE — Patient Instructions (Addendum)
 Allergic Rhinoconjunctivitis: - Continue avoidance measures for grasses, ragweed, weeds, outdoor molds, cat, and dog. - Continue:   Xyzal  (levocetirizine) 5mg  tablet daily. Singulair  10mg  tablet daily.  Dymista  2 sprays per nostril 2 times daily  Pataday  (olopatadine ) one drop per eye daily IF NEEDED for itchy/watery eyes.   - You can use an extra dose of the antihistamine, if needed, for breakthrough symptoms.  - Start allergy  shots once you are doing better.  Scheduled for 2/2.  Bring Epipen  with each shot visit.    Mild Persistent Asthma: - Daily controller medication(s):  Stop Qvar , start Symbicort  80-4.71mcg 2 puffs twice daily   Rinse mouth after use.  - Prior to physical activity: albuterol  2 puffs 10-15 minutes before physical activity. - Rescue medications: albuterol  2-4 puffs every 4-6 hours as needed  - Asthma control goals:  * Full participation in all desired activities (may need albuterol  before activity) * Albuterol  use two time or less a week on average (not counting use with activity) * Cough interfering with sleep two time or less a month * Oral steroids no more than once a year * No hospitalizations  GERD:  - Increase to Omeprazole  40mg  twice daily for 4 weeks.  Then decrease back to 40mg  daily. Take on an empty stomach and eat a meal/snack 45 minutes -Avoid lying down for at least two hours after a meal or after drinking acidic beverages, like soda, or other caffeinated beverages. This can help to prevent stomach contents from flowing back into the esophagus. -Keep your head elevated while you sleep. Using an extra pillow or two can also help to prevent reflux. -Eat smaller and more frequent meals each day instead of a few large meals. This promotes digestion and can aid in preventing heartburn. -Wear loose-fitting clothes to ease pressure on the stomach, which can worsen heartburn and reflux. -Reduce excess weight around the midsection. This can ease pressure on the  stomach. Such pressure can force some stomach contents back up the esophagus.

## 2024-04-22 NOTE — Progress Notes (Signed)
 "  FOLLOW UP Date of Service/Encounter:  04/22/24   Subjective:  Tracy Burch (DOB: 1980-08-16) is a 44 y.o. female who [facility] for follow up for an acute visit.   History obtained from: chart review and patient. Last visit was with Wanda Craze, discussed uncontrolled rhinitis, consider AIT.  Continue Xyzal , Singulair , Dymista  and Pataday  eye drops. Also on Qvar  for asthma.   For the last week, notes waking up middle of night with a cough.  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  Albuterol  calms it but sometimes its persistent.  Taking Qvar  BID without much improvement.  Notes some wheezing, no trouble breathing.   Denies issues with congestion/post nasal drainage.  Using singulair , xyzal  and dymista . Also has trouble with heartburn and reflux, taking PPI daily.     Past Medical History: Past Medical History:  Diagnosis Date   Allergy     Anemia    Chicken pox    Chlamydia    remote h/o   UTI (urinary tract infection)     Objective:  LMP  (LMP Unknown)  There is no height or weight on file to calculate BMI. Physical Exam: GEN: alert, well developed HEENT: clear conjunctiva, nose with mild inferior turbinate hypertrophy, pink nasal mucosa, slight clear rhinorrhea, no cobblestoning HEART: regular rate and rhythm, no murmur LUNGS: clear to auscultation bilaterally, no coughing, unlabored respiration SKIN: no rashes or lesions  Spirometry:  Tracings reviewed. Her effort: Good reproducible efforts. FVC: 3.24L, 97% predicted  FEV1: 2.82L, 103% predicted FEV1/FVC ratio: 87% Interpretation: Spirometry consistent with normal pattern.  Please see scanned spirometry results for details.  Assessment:   1. Seasonal and perennial allergic rhinitis   2. Mild persistent asthma without complication   3. Gastroesophageal reflux disease, unspecified whether esophagitis present     Plan/Recommendations:  Mild Persistent  Asthma: - Current cough likely related uncontrolled asthma + GERD.  Will escalate therapy to Symbicort  and temporarily increase to PPI BID.   - Daily controller medication(s):  Stop Qvar , start Symbicort  80-4.52mcg 2 puffs twice daily   Rinse mouth after use.  - Prior to physical activity: albuterol  2 puffs 10-15 minutes before physical activity. - Rescue medications: albuterol  2-4 puffs every 4-6 hours as needed  - Asthma control goals:  * Full participation in all desired activities (may need albuterol  before activity) * Albuterol  use two time or less a week on average (not counting use with activity) * Cough interfering with sleep two time or less a month * Oral steroids no more than once a year * No hospitalizations  GERD:  - Increase to Omeprazole  40mg  twice daily for 4 weeks.  Then decrease back to 40mg  daily. Take on an empty stomach and eat a meal/snack 45 minutes -Avoid lying down for at least two hours after a meal or after drinking acidic beverages, like soda, or other caffeinated beverages. This can help to prevent stomach contents from flowing back into the esophagus. -Keep your head elevated while you sleep. Using an extra pillow or two can also help to prevent reflux. -Eat smaller and more frequent meals each day instead of a few large meals. This promotes digestion and can aid in preventing heartburn. -Wear loose-fitting clothes to ease pressure on the stomach, which can worsen heartburn and reflux. -Reduce excess weight around the midsection. This can ease pressure on the stomach. Such pressure can force some stomach contents back up the esophagus.   Allergic Rhinoconjunctivitis: -  Continue avoidance measures for grasses, ragweed, weeds, outdoor molds, cat, and dog. - Continue:   Xyzal  (levocetirizine) 5mg  tablet daily. Singulair  10mg  tablet daily.  Dymista  2 sprays per nostril 2 times daily  Pataday  (olopatadine ) one drop per eye daily IF NEEDED for itchy/watery eyes.   -  You can use an extra dose of the antihistamine, if needed, for breakthrough symptoms.  - Start allergy  shots once you are doing better.  Scheduled for 2/2.  Bring Epipen  with each shot visit.       Arleta Blanch, MD [facility]       "

## 2024-04-25 ENCOUNTER — Other Ambulatory Visit (HOSPITAL_COMMUNITY): Payer: Self-pay

## 2024-04-25 ENCOUNTER — Telehealth: Payer: Self-pay

## 2024-04-25 NOTE — Telephone Encounter (Signed)
 Pharmacy Patient Advocate Encounter   Received notification from Patient Advice Request messages that prior authorization for Wegovy  0.25MG /0.5ML auto-injectors is required/requested.   Insurance verification completed.   The patient is insured through [employer].   Per test claim: PA required; PA submitted to above mentioned insurance via Latent Key/confirmation #/EOC [id number] Status is pending

## 2024-04-25 NOTE — Telephone Encounter (Signed)
 Patient is needing a PA on [facility]

## 2024-04-26 ENCOUNTER — Other Ambulatory Visit (HOSPITAL_COMMUNITY): Payer: Self-pay

## 2024-04-26 NOTE — Telephone Encounter (Signed)
 Sent a mychart message letting patient know that medication was approved. See other telephone thread

## 2024-04-26 NOTE — Telephone Encounter (Signed)
 Pharmacy Patient Advocate Encounter  Received notification from [employer] that Prior Authorization for Wegovy  0.25MG /0.5ML auto-injectors has been APPROVED from 04/25/24 to 10/22/24. Ran test claim, Copay is $4. This test claim was processed through [facility]- copay amounts may vary at other pharmacies due to pharmacy/plan contracts, or as the patient moves through the different stages of their insurance plan.   PA #/Case ID/Reference #: [id number]

## 2024-04-27 MED ORDER — BUDESONIDE-FORMOTEROL FUMARATE 80-4.5 MCG/ACT IN AERO: 2.0000 | INHALATION_SPRAY | Freq: Two times a day (BID) | RESPIRATORY_TRACT | 5 refills | Status: AC

## 2024-04-27 NOTE — Addendum Note (Signed)
 Addended by: ONEITA CHRISTIANS D on: 04/27/2024 10:01 AM   Modules accepted: Orders

## 2024-04-29 ENCOUNTER — Other Ambulatory Visit: Payer: Self-pay | Admitting: *Deleted

## 2024-04-29 MED ORDER — OMEPRAZOLE 40 MG PO CPDR: 40.0000 mg | DELAYED_RELEASE_CAPSULE | Freq: Two times a day (BID) | ORAL | 0 refills | Status: AC

## 2024-05-02 ENCOUNTER — Ambulatory Visit

## 2024-05-11 ENCOUNTER — Ambulatory Visit

## 2024-05-19 ENCOUNTER — Ambulatory Visit: Admitting: Allergy

## 2024-10-18 ENCOUNTER — Ambulatory Visit: Admitting: Family Medicine

## 2025-04-18 ENCOUNTER — Encounter: Admitting: Family Medicine
